# Patient Record
Sex: Male | Born: 1975 | Race: White | Hispanic: No | Marital: Single | Smoking: Current every day smoker
Health system: Southern US, Community
[De-identification: ages and names within clinical notes are randomized; demographics above are authoritative.]

---

## 2001-02-16 ENCOUNTER — Emergency Department (HOSPITAL_COMMUNITY): Admission: EM | Admit: 2001-02-16 | Discharge: 2001-02-16 | Payer: Self-pay | Admitting: *Deleted

## 2004-12-29 ENCOUNTER — Ambulatory Visit: Payer: Self-pay | Admitting: Family Medicine

## 2005-03-25 ENCOUNTER — Ambulatory Visit: Payer: Self-pay | Admitting: Family Medicine

## 2006-08-12 ENCOUNTER — Inpatient Hospital Stay (HOSPITAL_COMMUNITY): Admission: AC | Admit: 2006-08-12 | Discharge: 2006-08-16 | Payer: Self-pay

## 2007-10-22 ENCOUNTER — Emergency Department (HOSPITAL_COMMUNITY): Admission: EM | Admit: 2007-10-22 | Discharge: 2007-10-23 | Payer: Self-pay | Admitting: Emergency Medicine

## 2007-10-26 ENCOUNTER — Emergency Department (HOSPITAL_COMMUNITY): Admission: EM | Admit: 2007-10-26 | Discharge: 2007-10-26 | Payer: Self-pay | Admitting: Emergency Medicine

## 2009-11-18 ENCOUNTER — Emergency Department (HOSPITAL_COMMUNITY): Admission: EM | Admit: 2009-11-18 | Discharge: 2009-11-18 | Payer: Self-pay | Admitting: Emergency Medicine
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
## 2010-08-22 ENCOUNTER — Emergency Department (HOSPITAL_COMMUNITY): Payer: Self-pay

## 2010-08-22 ENCOUNTER — Emergency Department (HOSPITAL_COMMUNITY)
Admission: EM | Admit: 2010-08-22 | Discharge: 2010-08-22 | Disposition: A | Discharge status: Home / Self Care | Payer: Self-pay | Attending: Emergency Medicine | Admitting: Emergency Medicine

## 2010-08-22 DIAGNOSIS — R1032 Left lower quadrant pain: Secondary | ICD-10-CM | POA: Insufficient documentation

## 2010-08-22 DIAGNOSIS — K5732 Diverticulitis of large intestine without perforation or abscess without bleeding: Secondary | ICD-10-CM | POA: Insufficient documentation

## 2010-08-22 DIAGNOSIS — N509 Disorder of male genital organs, unspecified: Secondary | ICD-10-CM | POA: Insufficient documentation

## 2010-08-22 LAB — URINALYSIS, ROUTINE W REFLEX MICROSCOPIC
Glucose, UA: NEGATIVE mg/dL
Hgb urine dipstick: NEGATIVE
Leukocytes, UA: NEGATIVE
Specific Gravity, Urine: 1.015 (ref 1.005–1.030)

## 2010-08-22 LAB — COMPREHENSIVE METABOLIC PANEL
ALT: 28 U/L (ref 0–53)
AST: 24 U/L (ref 0–37)
CO2: 28 mEq/L (ref 19–32)
Calcium: 9.8 mg/dL (ref 8.4–10.5)
Chloride: 100 mEq/L (ref 96–112)
GFR calc Af Amer: >60 mL/min (ref >60)
GFR calc non Af Amer: >60 mL/min (ref >60)
Glucose, Bld: 102 mg/dL — ABNORMAL HIGH (ref 70–99)
Sodium: 137 mEq/L (ref 135–145)
Total Bilirubin: 0.2 mg/dL — ABNORMAL LOW (ref 0.3–1.2)

## 2010-08-22 LAB — CBC
Hemoglobin: 13.4 g/dL (ref 13.0–17.0)
MCH: 30.3 pg (ref 26.0–34.0)
MCV: 87.3 fL (ref 78.0–100.0)
Platelets: 329 10*3/uL (ref 150–400)
RBC: 4.42 MIL/uL (ref 4.22–5.81)
WBC: 14.3 10*3/uL — ABNORMAL HIGH (ref 4.0–10.5)

## 2010-08-22 LAB — DIFFERENTIAL
Basophils Relative: 0 % (ref 0–1)
Eosinophils Absolute: 0.5 10*3/uL (ref 0.0–0.7)
Lymphs Abs: 4 10*3/uL (ref 0.7–4.0)
Monocytes Relative: 7 % (ref 3–12)
Neutro Abs: 8.8 10*3/uL — ABNORMAL HIGH (ref 1.7–7.7)
Neutrophils Relative %: 62 % (ref 43–77)

## 2010-08-23 ENCOUNTER — Emergency Department (HOSPITAL_COMMUNITY)
Admission: EM | Admit: 2010-08-23 | Discharge: 2010-08-23 | Discharge status: Against Medical Advice | Payer: Self-pay | Attending: Emergency Medicine | Admitting: Emergency Medicine

## 2010-08-23 ENCOUNTER — Encounter: Payer: Self-pay | Admitting: Emergency Medicine

## 2010-08-23 DIAGNOSIS — N509 Disorder of male genital organs, unspecified: Secondary | ICD-10-CM | POA: Insufficient documentation

## 2010-08-23 DIAGNOSIS — R112 Nausea with vomiting, unspecified: Secondary | ICD-10-CM | POA: Insufficient documentation

## 2010-08-23 DIAGNOSIS — R1032 Left lower quadrant pain: Secondary | ICD-10-CM | POA: Insufficient documentation

## 2010-08-23 DIAGNOSIS — F17200 Nicotine dependence, unspecified, uncomplicated: Secondary | ICD-10-CM | POA: Insufficient documentation

## 2010-08-23 DIAGNOSIS — K5732 Diverticulitis of large intestine without perforation or abscess without bleeding: Secondary | ICD-10-CM | POA: Insufficient documentation

## 2010-08-23 LAB — COMPREHENSIVE METABOLIC PANEL
ALT: 34 U/L (ref 0–53)
AST: 29 U/L (ref 0–37)
Albumin: 3.6 g/dL (ref 3.5–5.2)
Calcium: 10.1 mg/dL (ref 8.4–10.5)
GFR calc Af Amer: >60 mL/min (ref >60)
Glucose, Bld: 107 mg/dL — ABNORMAL HIGH (ref 70–99)
Potassium: 4 mEq/L (ref 3.5–5.1)
Sodium: 135 mEq/L (ref 135–145)
Total Protein: 7.4 g/dL (ref 6.0–8.3)

## 2010-08-23 LAB — URINALYSIS, ROUTINE W REFLEX MICROSCOPIC
Nitrite: NEGATIVE
Specific Gravity, Urine: 1.025 (ref 1.005–1.030)
Urobilinogen, UA: 0.2 mg/dL (ref 0.0–1.0)
pH: 5.5 (ref 5.0–8.0)

## 2010-08-23 LAB — CBC
MCH: 30.6 pg (ref 26.0–34.0)
MCHC: 35.2 g/dL (ref 30.0–36.0)
Platelets: 322 10*3/uL (ref 150–400)
RDW: 13.2 % (ref 11.5–15.5)

## 2010-08-23 LAB — URINE MICROSCOPIC-ADD ON

## 2010-08-23 LAB — DIFFERENTIAL
Basophils Absolute: 0 10*3/uL (ref 0.0–0.1)
Basophils Relative: 0 % (ref 0–1)
Eosinophils Absolute: 0.4 10*3/uL (ref 0.0–0.7)
Neutro Abs: 9.2 10*3/uL — ABNORMAL HIGH (ref 1.7–7.7)
Neutrophils Relative %: 65 % (ref 43–77)

## 2010-08-23 MED ORDER — CIPROFLOXACIN IN D5W 400 MG/200ML IV SOLN
400.0000 mg | Freq: Once | INTRAVENOUS | Status: AC
  Administered 2010-08-23: 400 mg via INTRAVENOUS
  Filled 2010-08-23: qty 200

## 2010-08-23 MED ORDER — ONDANSETRON HCL 4 MG/2ML IJ SOLN
4.0000 mg | Freq: Once | INTRAMUSCULAR | Status: AC
  Administered 2010-08-23: 4 mg via INTRAVENOUS
  Filled 2010-08-23: qty 2

## 2010-08-23 MED ORDER — HYDROMORPHONE HCL 1 MG/ML IJ SOLN
1.0000 mg | Freq: Once | INTRAMUSCULAR | Status: AC
  Administered 2010-08-23: 1 mg via INTRAVENOUS
  Filled 2010-08-23: qty 1

## 2010-08-23 MED ORDER — SODIUM CHLORIDE 0.9 % IV SOLN: INTRAVENOUS | Status: DC

## 2010-08-23 MED ORDER — SODIUM CHLORIDE 0.9 % IV SOLN
999.0000 mL | Freq: Once | INTRAVENOUS | Status: AC
  Administered 2010-08-23: 1000 mL via INTRAVENOUS

## 2010-08-23 MED ORDER — METRONIDAZOLE IN NACL 5-0.79 MG/ML-% IV SOLN
500.0000 mg | Freq: Once | INTRAVENOUS | Status: AC
  Administered 2010-08-23: 500 mg via INTRAVENOUS
  Filled 2010-08-23: qty 100

## 2010-08-23 NOTE — ED Provider Notes (Signed)
History     Chief Complaint  Patient presents with  . Abdominal Pain    Pt c/o L lower abd pain and testicle pain x 3 days. here yesterday and was supposed to be admitted.    HPI  History reviewed. No pertinent past medical history.  History reviewed. No pertinent past surgical history.  History reviewed. No pertinent family history.  History  Substance Use Topics  . Smoking status: Current Everyday Smoker -- 0.5 packs/day    Types: Cigarettes  . Smokeless tobacco: Not on file  . Alcohol Use: Yes     on weekends      Review of Systems  Physical Exam  BP 111/77  Pulse 84  Temp(Src) 97.6 F (36.4 C) (Oral)  Resp 20  Ht 5\' 6"  (1.676 m)  Wt 195 lb (88.451 kg)  BMI 31.47 kg/m2  SpO2 98%  Physical Exam  ED Course  Procedures  MDM Patient with diverticulitis diagnosed yesterday by CT scan. He refused admission so that he could take care of some personal matters, returns to be admitted. He is very tender across the lower abdomen.      Dione Booze, MD 08/23/10 1215

## 2010-08-23 NOTE — ED Notes (Signed)
Pt req something to eat and drink. Pt was already aware he could not have either per edpa. Pt req now to go out to smoke. Pt made aware e he cannot. Pt is to be admit but unsure at this moment if he will stay

## 2010-08-23 NOTE — ED Provider Notes (Signed)
History     Chief Complaint  Patient presents with  . Abdominal Pain    Pt c/o L lower abd pain and testicle pain x 3 days. here yesterday and was supposed to be admitted.    HPI Comments: PAtient was seen here yesterday and treated for diverticulitis.  It was recommended to him that he be admitted but he did not have anyone to stay with his daughter that that time.  Returns today c/o worsening pain and requesting admission  Patient is a 35 y.o. male presenting with abdominal pain. The history is provided by the patient.  Abdominal Pain The primary symptoms of the illness include abdominal pain, nausea and vomiting. The primary symptoms of the illness do not include fever, shortness of breath or hematemesis. The current episode started more than 2 days ago. The onset of the illness was gradual. The problem has been gradually worsening.  Nausea began 2 days ago. The nausea is associated with eating. The nausea is exacerbated by food.  The vomiting began 2 days ago. Vomiting occurs 2 to 5 times per day. The emesis contains stomach contents.  The patient has had a change in bowel habit. Additional symptoms associated with the illness include constipation and back pain. Symptoms associated with the illness do not include chills, urgency or hematuria. Significant associated medical issues do not include PUD, diabetes, substance abuse or cardiac disease.    History reviewed. No pertinent past medical history.  History reviewed. No pertinent past surgical history.  History reviewed. No pertinent family history.  History  Substance Use Topics  . Smoking status: Current Everyday Smoker -- 0.5 packs/day    Types: Cigarettes  . Smokeless tobacco: Not on file  . Alcohol Use: Yes     on weekends      Review of Systems  Constitutional: Negative for fever and chills.  HENT: Negative.   Eyes: Negative for photophobia and visual disturbance.  Respiratory: Negative for cough, shortness of breath  and wheezing.   Cardiovascular: Negative for chest pain and leg swelling.  Gastrointestinal: Positive for nausea, vomiting, abdominal pain and constipation. Negative for blood in stool, abdominal distention and hematemesis.  Genitourinary: Positive for testicular pain. Negative for urgency, hematuria, flank pain, scrotal swelling and difficulty urinating.  Musculoskeletal: Positive for back pain.  Skin: Negative.   Neurological: Negative.   Hematological: Negative.     Physical Exam  BP 111/77  Pulse 84  Temp(Src) 97.6 F (36.4 C) (Oral)  Resp 20  Ht 5\' 6"  (1.676 m)  Wt 195 lb (88.451 kg)  BMI 31.47 kg/m2  SpO2 98%  Physical Exam  Constitutional: He is oriented to person, place, and time. He appears well-developed and well-nourished.  Non-toxic appearance.    HENT:  Head: Normocephalic and atraumatic.  Eyes: EOM are normal. Pupils are equal, round, and reactive to light.  Neck: Normal range of motion. Neck supple.  Cardiovascular: Normal rate, regular rhythm and normal heart sounds.   Pulmonary/Chest: Effort normal and breath sounds normal. No respiratory distress.  Abdominal: Soft. Bowel sounds are normal. He exhibits no distension. There is no splenomegaly. There is tenderness in the left lower quadrant. There is guarding. There is no rigidity and no rebound.  Musculoskeletal: Normal range of motion.  Neurological: He is alert and oriented to person, place, and time.  Skin: Skin is warm and dry.  Psychiatric: He has a normal mood and affect.    ED Course  Procedures  MDM  Patient had nml [place]  of testicles and CT abd and pelvis on7/6/12.  CT scan shows sigmoid diverticulitis w/o abscess or perforation  I have reviewed all results   Eivin Mascio L. Omesha Bowerman, [place] 08/23/10 1608

## 2010-08-23 NOTE — ED Notes (Signed)
Pt pulled iv out. Stated he was going home. edp aware

## 2010-08-23 NOTE — ED Notes (Signed)
Pt states he was here yesterday with his testicles swollen. Was to be admit but could not stay. Back today to be admit
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
## 2010-11-08 NOTE — ED Provider Notes (Signed)
Medical screening examination/treatment/procedure(s) were performed by non-physician practitioner and as supervising physician I was immediately available for consultation/collaboration.   Shelda Jakes, MD 11/08/10 [phone]

## 2010-11-19 LAB — RAPID URINE DRUG SCREEN, HOSP PERFORMED
Amphetamines: NOT DETECTED
Barbiturates: NOT DETECTED
Tetrahydrocannabinol: NOT DETECTED

## 2010-11-19 LAB — URINALYSIS, ROUTINE W REFLEX MICROSCOPIC
Bilirubin Urine: NEGATIVE
Hgb urine dipstick: NEGATIVE
Protein, ur: NEGATIVE
Urobilinogen, UA: 0.2

## 2010-12-03 LAB — RAPID URINE DRUG SCREEN, HOSP PERFORMED
Amphetamines: NOT DETECTED
Cocaine: POSITIVE — AB
Opiates: POSITIVE — AB
Tetrahydrocannabinol: NOT DETECTED

## 2010-12-03 LAB — ETHANOL
Alcohol, Ethyl (B): 33 — ABNORMAL HIGH
Alcohol, Ethyl (B): 76 — ABNORMAL HIGH

## 2010-12-03 LAB — CBC
HCT: 38.2 — ABNORMAL LOW
Hemoglobin: 13.2
Hemoglobin: 14.1
Platelets: 258
RBC: 4.19 — ABNORMAL LOW
RBC: 4.56
RDW: 14.4 — ABNORMAL HIGH
WBC: 12.6 — ABNORMAL HIGH
WBC: 22.7 — ABNORMAL HIGH

## 2010-12-03 LAB — I-STAT 8, (EC8 V) (CONVERTED LAB)
Bicarbonate: 20.6
Operator id: 270651
Sodium: 141
TCO2: 22

## 2010-12-03 LAB — BASIC METABOLIC PANEL
BUN: 8
Calcium: 8.2 — ABNORMAL LOW
GFR calc non Af Amer: >60
GFR calc non Af Amer: >60
Glucose, Bld: 121 — ABNORMAL HIGH
Potassium: 3.8
Sodium: 137
Sodium: 140

## 2010-12-03 LAB — URINALYSIS, ROUTINE W REFLEX MICROSCOPIC
Bilirubin Urine: NEGATIVE
Leukocytes, UA: NEGATIVE
Nitrite: NEGATIVE
Specific Gravity, Urine: 1.028
Urobilinogen, UA: 0.2
pH: 6

## 2010-12-03 LAB — POCT I-STAT CREATININE
Creatinine, Ser: 1.1
Operator id: 270651

## 2010-12-03 LAB — URINE MICROSCOPIC-ADD ON

## 2010-12-03 LAB — PROTIME-INR: INR: 1.1

## 2011-05-19 ENCOUNTER — Emergency Department (HOSPITAL_COMMUNITY): Payer: Self-pay

## 2011-05-19 ENCOUNTER — Encounter (HOSPITAL_COMMUNITY): Payer: Self-pay | Admitting: Emergency Medicine

## 2011-05-19 ENCOUNTER — Emergency Department (HOSPITAL_COMMUNITY)
Admission: EM | Admit: 2011-05-19 | Discharge: 2011-05-19 | Disposition: A | Discharge status: Home / Self Care | Payer: Self-pay | Attending: Emergency Medicine | Admitting: Emergency Medicine

## 2011-05-19 DIAGNOSIS — K921 Melena: Secondary | ICD-10-CM | POA: Insufficient documentation

## 2011-05-19 DIAGNOSIS — K5792 Diverticulitis of intestine, part unspecified, without perforation or abscess without bleeding: Secondary | ICD-10-CM

## 2011-05-19 DIAGNOSIS — R112 Nausea with vomiting, unspecified: Secondary | ICD-10-CM | POA: Insufficient documentation

## 2011-05-19 DIAGNOSIS — K5900 Constipation, unspecified: Secondary | ICD-10-CM | POA: Insufficient documentation

## 2011-05-19 DIAGNOSIS — K5732 Diverticulitis of large intestine without perforation or abscess without bleeding: Secondary | ICD-10-CM | POA: Insufficient documentation

## 2011-05-19 DIAGNOSIS — R1032 Left lower quadrant pain: Secondary | ICD-10-CM | POA: Insufficient documentation

## 2011-05-19 DIAGNOSIS — N509 Disorder of male genital organs, unspecified: Secondary | ICD-10-CM | POA: Insufficient documentation

## 2011-05-19 DIAGNOSIS — R197 Diarrhea, unspecified: Secondary | ICD-10-CM | POA: Insufficient documentation

## 2011-05-19 LAB — COMPREHENSIVE METABOLIC PANEL
Albumin: 4 g/dL (ref 3.5–5.2)
BUN: 12 mg/dL (ref 6–23)
Chloride: 104 mEq/L (ref 96–112)
Creatinine, Ser: 0.85 mg/dL (ref 0.50–1.35)
GFR calc non Af Amer: >90 mL/min (ref >90)
Total Bilirubin: 0.2 mg/dL — ABNORMAL LOW (ref 0.3–1.2)

## 2011-05-19 LAB — CBC
HCT: 45 % (ref 39.0–52.0)
Hemoglobin: 15.6 g/dL (ref 13.0–17.0)
MCH: 30.4 pg (ref 26.0–34.0)
MCHC: 34.7 g/dL (ref 30.0–36.0)
MCV: 87.5 fL (ref 78.0–100.0)

## 2011-05-19 LAB — URINE MICROSCOPIC-ADD ON

## 2011-05-19 LAB — URINALYSIS, ROUTINE W REFLEX MICROSCOPIC
Ketones, ur: NEGATIVE mg/dL
Leukocytes, UA: NEGATIVE
Nitrite: NEGATIVE
Protein, ur: NEGATIVE mg/dL
pH: 5 (ref 5.0–8.0)

## 2011-05-19 LAB — LIPASE, BLOOD: Lipase: 49 U/L (ref 11–59)

## 2011-05-19 LAB — DIFFERENTIAL
Basophils Relative: 0 % (ref 0–1)
Monocytes Absolute: 1.4 10*3/uL — ABNORMAL HIGH (ref 0.1–1.0)
Monocytes Relative: 7 % (ref 3–12)
Neutro Abs: 14.7 10*3/uL — ABNORMAL HIGH (ref 1.7–7.7)

## 2011-05-19 MED ORDER — OXYCODONE-ACETAMINOPHEN 5-325 MG PO TABS: 2.0000 | ORAL_TABLET | ORAL | Status: AC | PRN

## 2011-05-19 MED ORDER — ONDANSETRON HCL 4 MG/2ML IJ SOLN
4.0000 mg | Freq: Once | INTRAMUSCULAR | Status: AC
  Administered 2011-05-19: 4 mg via INTRAVENOUS
  Filled 2011-05-19: qty 2

## 2011-05-19 MED ORDER — DOCUSATE SODIUM 100 MG PO CAPS: 100.0000 mg | ORAL_CAPSULE | Freq: Two times a day (BID) | ORAL | Status: AC

## 2011-05-19 MED ORDER — SODIUM CHLORIDE 0.9 % IV BOLUS (SEPSIS)
1000.0000 mL | Freq: Once | INTRAVENOUS | Status: AC
  Administered 2011-05-19: 1000 mL via INTRAVENOUS

## 2011-05-19 MED ORDER — SODIUM CHLORIDE 0.9 % IV SOLN: INTRAVENOUS | Status: DC

## 2011-05-19 MED ORDER — CIPROFLOXACIN HCL 500 MG PO TABS: 500.0000 mg | ORAL_TABLET | Freq: Two times a day (BID) | ORAL | Status: AC

## 2011-05-19 MED ORDER — HYDROMORPHONE HCL PF 1 MG/ML IJ SOLN
1.0000 mg | Freq: Once | INTRAMUSCULAR | Status: AC
  Administered 2011-05-19: 1 mg via INTRAVENOUS
  Filled 2011-05-19: qty 1

## 2011-05-19 MED ORDER — IOHEXOL 300 MG/ML  SOLN
100.0000 mL | Freq: Once | INTRAMUSCULAR | Status: AC | PRN
  Administered 2011-05-19: 100 mL via INTRAVENOUS

## 2011-05-19 MED ORDER — METRONIDAZOLE 500 MG PO TABS: 500.0000 mg | ORAL_TABLET | Freq: Three times a day (TID) | ORAL | Status: AC

## 2011-05-19 MED ORDER — ONDANSETRON HCL 8 MG PO TABS: 8.0000 mg | ORAL_TABLET | Freq: Three times a day (TID) | ORAL | Status: AC | PRN

## 2011-05-19 NOTE — ED Provider Notes (Signed)
History  This chart was scribed for Steven Bonier, MD by Steven Skinner. This patient was seen in room APA12/APA12 and the patient's care was started at 4:06PM.  CSN: [id number]  Arrival date & time 05/19/11  1448   First MD Initiated Contact with Patient 05/19/11 1550      Chief Complaint  Patient presents with  . Abdominal Pain    The history is provided by the patient. No language interpreter was used.    Steven Skinner is a 36 y.o. male with a h/o diverticulitis who presents to the Emergency Department complaining of 4 to 5 days of gradual onset, gradually worsening, constant LLQ abdominal pain. The pain is described as a throbbing and sharp feeling. It radiates to the left testicle. The pain is worse with movement and touch. He lists nausea, emesis and constipation as associated symptoms. He has not taken any medications PTA to improve pain. Pt states that he came in to be evaluated today, because the pain is at its worst and he fears that he might have diverticulitis again.  He reports one episode of hematochezia on 05/15/11 and states that he has not had a BM since then. He reports two episodes of hematemesis  today and yesterday. He denies fever, congestion, diarrhea, chest pain, dysuria, urgency and HA as associated symptoms. Pt has no other h/o chronic medical conditions. He is a current everyday smoker and occasional alcohol user.  Past medical history: Diverticulitis   History reviewed. No pertinent past surgical history.  No family history on file.  History  Substance Use Topics  . Smoking status: Current Everyday Smoker -- 1.0 packs/day    Types: Cigarettes  . Smokeless tobacco: Not on file  . Alcohol Use: Yes     on weekends      Review of Systems  Constitutional: Negative for fever and chills.  HENT: Negative for congestion, sore throat and neck pain.   Eyes: Negative for pain.  Respiratory: Negative for cough and shortness of breath.   Cardiovascular:  Negative for chest pain.  Gastrointestinal: Positive for nausea, vomiting, abdominal pain and constipation. Negative for diarrhea.  Genitourinary: Positive for testicular pain (Left). Negative for dysuria, urgency and hematuria.  Musculoskeletal: Negative for back pain.  Skin: Negative for rash.  Neurological: Negative for seizures and headaches.  Psychiatric/Behavioral: Negative for confusion.    Allergies  Review of patient's allergies indicates no known allergies.  Home Medications  No current outpatient prescriptions on file.  Triage Vitals: BP 115/82  Pulse 97  Temp(Src) 98.1 F (36.7 C) (Oral)  Resp 20  Ht 5\' 7"  (1.702 m)  Wt 195 lb (88.451 kg)  BMI 30.54 kg/m2  SpO2 99%  Physical Exam  Nursing note and vitals reviewed. Constitutional: He is oriented to person, place, and time. He appears well-developed and well-nourished.  HENT:  Head: Normocephalic and atraumatic.  Eyes: Conjunctivae and EOM are normal.  Neck: Normal range of motion. Neck supple.  Cardiovascular: Normal rate, regular rhythm and normal heart sounds.  Exam reveals no gallop and no friction rub.   No murmur heard. Pulmonary/Chest: Effort normal and breath sounds normal. No respiratory distress. He has no wheezes. He has no rales.       Lungs are clear to auscultation in all fields, no rhonchi  Abdominal: Soft. Bowel sounds are normal. There is tenderness (LU and LL quandrants).  Genitourinary:       Testicles are normal bilaterally and equal in size, no masses noted, they  are non-tender to palpation  Musculoskeletal: Normal range of motion. He exhibits no edema.  Neurological: He is alert and oriented to person, place, and time.  Skin: Skin is warm and dry. No rash noted.  Psychiatric: He has a normal mood and affect. His behavior is normal.    ED Course  Procedures (including critical care time)  DIAGNOSTIC STUDIES: Oxygen Saturation is 99% on room air, normal by my interpretation.     COORDINATION OF CARE: 4:10PM-Discussed treatment plan with pt and pt agreed to plan.   Labs Reviewed  CBC - Abnormal; Notable for the following:    WBC 20.0 (*)    All other components within normal limits  DIFFERENTIAL - Abnormal; Notable for the following:    Neutro Abs 14.7 (*)    Monocytes Absolute 1.4 (*)    All other components within normal limits  COMPREHENSIVE METABOLIC PANEL - Abnormal; Notable for the following:    Total Bilirubin 0.2 (*)    All other components within normal limits  URINALYSIS, ROUTINE W REFLEX MICROSCOPIC - Abnormal; Notable for the following:    Specific Gravity, Urine >1.030 (*)    Hgb urine dipstick TRACE (*)    All other components within normal limits  LIPASE, BLOOD  URINE MICROSCOPIC-ADD ON   US Scrotum  05/19/2011  *RADIOLOGY REPORT*  Clinical Data:  Left testicular pain.  SCROTAL ULTRASOUND DOPPLER ULTRASOUND OF THE TESTICLES  Technique: Complete ultrasound examination of the testicles, epididymis, and other scrotal structures was performed.  Color and spectral Doppler ultrasound were also utilized to evaluate blood flow to the testicles.  Comparison:  08/22/2010.  Findings:  Right testis:  Measures 5.3 x 2.2 x 3.2 cm and appears normal.  Left testis:  Measures 4.7 x 2.3 x 2.7 cm and appears normal.  Right epididymis:  Contains a 3 mm epididymal cyst or spermatocele; otherwise normal.  Left epididymis:  Appears normal.  Hydocele:  Trace bilateral hydroceles noted.  Varicocele:  Absent  Pulsed Doppler interrogation of both testes demonstrates normal low resistance flow bilaterally.  Mild nodularity noted in the scrotal skin, for example on image 50.  IMPRESSION:  1.  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 2.  Trace bilateral hydroceles. 3.   Otherwise, no significant abnormality identified.  Original Report Authenticated By: Steven Skinner, M.D.   Ct Abdomen Pelvis W  Contrast  05/19/2011  *RADIOLOGY REPORT*  Clinical Data: Left lower quadrant abdominal pain.  CT ABDOMEN AND PELVIS WITH CONTRAST  Technique:  Multidetector CT imaging of the abdomen and pelvis was performed following the standard protocol during bolus administration of intravenous contrast.  Contrast:  100 ml Omnipaque-300  Comparison: CT scan 08/22/2010.  Findings: The lung bases are clear.  No pleural effusion.  The solid abdominal organs are normal.  The gallbladder is contracted.  No common bile duct dilatation.  No renal or obstructing ureteral calculi.  The stomach, duodenum, small bowel and colon are unremarkable.  No inflammatory changes or mass lesions.  There is diverticulosis of the upper sigmoid colon but no findings for acute diverticulitis. The appendix is normal.  The aorta is normal in caliber.  The major branch vessels are normal.  No mesenteric or retroperitoneal masses or lymphadenopathy.  Small scattered lymph nodes are noted.  The bladder, prostate gland and seminal vesicles are unremarkable. No pelvic mass, adenopathy or free pelvic fluid collections.  No inguinal mass or hernia.  The bony structures are intact.  IMPRESSION:  Unremarkable CT abdomen/pelvis.  Original Report Authenticated By: P. Loralie Champagne, M.D.   [place] Art/ven Flow Abd Pelv Doppler  05/19/2011  *RADIOLOGY REPORT*  Clinical Data:  Left testicular pain.  SCROTAL ULTRASOUND DOPPLER ULTRASOUND OF THE TESTICLES  Technique: Complete ultrasound examination of the testicles, epididymis, and other scrotal structures was performed.  Color and spectral Doppler ultrasound were also utilized to evaluate blood flow to the testicles.  Comparison:  08/22/2010.  Findings:  Right testis:  Measures 5.3 x 2.2 x 3.2 cm and appears normal.  Left testis:  Measures 4.7 x 2.3 x 2.7 cm and appears normal.  Right epididymis:  Contains a 3 mm epididymal cyst or spermatocele; otherwise normal.  Left epididymis:  Appears normal.  Hydocele:  Trace  bilateral hydroceles noted.  Varicocele:  Absent  Pulsed Doppler interrogation of both testes demonstrates normal low resistance flow bilaterally.  Mild nodularity noted in the scrotal skin, for example on image 50.  IMPRESSION:  1.  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 2.  Trace bilateral hydroceles. 3.   Otherwise, no significant abnormality identified.  Original Report Authenticated By: Steven Skinner, M.D.     No diagnosis found.    MDM  Gastritis, peptic ulcer disease, renal colic, urinary tract infection, colitis, constipation, gastroenteritis all considered among other etiologies in the patient's differential diagnosis.  The patient has pain in his testicles, denies penile discharge or dysuria.    I personally performed the services described in this documentation, which was scribed in my presence. The recorded information has been reviewed and considered.  5:49 PM Laboratory and imaging findings have been reviewed. At this time although the patient does not show evidence of diverticulitis on the CT scan, he has a leukocytosis of 20,000, and findings on physical examination suggestive of diverticulitis, similar to his previous episodes of same, and despite the CT scan, I will treat the patient based on his clinical examination and leukocytosis for diverticulitis. No acute findings in the testicles or scrotum.  Steven Bonier, MD 05/19/11 1750

## 2011-05-19 NOTE — Discharge Instructions (Signed)
Abdominal Pain  Abdominal pain can be caused by many things. Your caregiver decides the seriousness of your pain by an examination and possibly blood tests and X-rays. Many cases can be observed and treated at home. Most abdominal pain is not caused by a disease and will probably improve without treatment. However, in many cases, more time must pass before a clear cause of the pain can be found. Before that point, it may not be known if you need more testing, or if hospitalization or surgery is needed.  HOME CARE INSTRUCTIONS   · Do not take laxatives unless directed by your caregiver.  · Take pain medicine only as directed by your caregiver.  · Only take over-the-counter or prescription medicines for pain, discomfort, or fever as directed by your caregiver.  · Try a clear liquid diet (broth, tea, or water) for as long as directed by your caregiver. Slowly move to a bland diet as tolerated.  SEEK IMMEDIATE MEDICAL CARE IF:   · The pain does not go away.  · You have a fever.  · You keep throwing up (vomiting).  · The pain is felt only in portions of the abdomen. Pain in the right side could possibly be appendicitis. In an adult, pain in the left lower portion of the abdomen could be colitis or diverticulitis.  · You pass bloody or black tarry stools.  MAKE SURE YOU:   · Understand these instructions.  · Will watch your condition.  · Will get help right away if you are not doing well or get worse.  Document Released: 11/12/2004 Document Revised: 01/22/2011 Document Reviewed: 09/21/2007  ExitCare® Patient Information ©2012 [employer].  Diverticulitis??  A diverticulum is a small pouch or sac on the colon. Diverticulosis is the presence of these diverticula on the colon. Diverticulitis is the irritation (inflammation) or infection of diverticula.  CAUSES   The colon and its diverticula contain bacteria. If food particles block the tiny opening to a diverticulum, the bacteria inside can grow and cause an increase in  pressure. This leads to infection and inflammation and is called diverticulitis.  SYMPTOMS   · Abdominal pain and tenderness. Usually, the pain is located on the left side of your abdomen. However, it could be located elsewhere.  · Fever.  · Bloating.  · Feeling sick to your stomach (nausea).  · Throwing up (vomiting).  · Abnormal stools.  DIAGNOSIS   Your caregiver will take a history and perform a physical exam. Since many things can cause abdominal pain, other tests may be necessary. Tests may include:  · Blood tests.  · Urine tests.  · X-ray of the abdomen.  · CT scan of the abdomen.  Sometimes, surgery is needed to determine if diverticulitis or other conditions are causing your symptoms.  TREATMENT   Most of the time, you can be treated without surgery. Treatment includes:  · Resting the bowels by only having liquids for a few days. As you improve, you will need to eat a low-fiber diet.  · Intravenous (IV) fluids if you are losing body fluids (dehydrated).  · Antibiotic medicines that treat infections may be given.  · Pain and nausea medicine, if needed.  · Surgery if the inflamed diverticulum has burst.  HOME CARE INSTRUCTIONS   · Try a clear liquid diet (broth, tea, or water for as long as directed by your caregiver). You may then gradually begin a low-fiber diet as tolerated. A low-fiber diet is a diet with less   cereals, rice, and pasta.   Cooked fruits and vegetables or soft fresh fruits and vegetables without the skin.   Ground or well-cooked tender beef, ham, veal, lamb, pork, or poultry.   Eggs and seafood.   After your diverticulitis symptoms have improved, your caregiver may put you on a high-fiber diet. A high-fiber diet includes 14 grams of fiber for every 1000 calories consumed. For a standard 2000 calorie diet, you would need 28 grams of fiber. Follow these diet  guidelines to help you increase the fiber in your diet. It is important to slowly increase the amount fiber in your diet to avoid gas, constipation, and bloating.   Choose whole-grain breads, cereals, pasta, and brown rice.   Choose fresh fruits and vegetables with the skin on. Do not overcook vegetables because the more vegetables are cooked, the more fiber is lost.   Choose more nuts, seeds, legumes, dried peas, beans, and lentils.   Look for food products that have greater than 3 grams of fiber per serving on the Nutrition Facts label.   Take all medicine as directed by your caregiver.   If your caregiver has given you a follow-up appointment, it is very important that you go. Not going could result in lasting (chronic) or permanent injury, pain, and disability. If there is any problem keeping the appointment, call to reschedule.  SEEK MEDICAL CARE IF:   Your pain does not improve.   You have a hard time advancing your diet beyond clear liquids.   Your bowel movements do not return to normal.  SEEK IMMEDIATE MEDICAL CARE IF:   Your pain becomes worse.   You have an oral temperature above 102 F (38.9 C), not controlled by medicine.   You have repeated vomiting.   You have bloody or black, tarry stools.   Symptoms that brought you to your caregiver become worse or are not getting better.  MAKE SURE YOU:   Understand these instructions.   Will watch your condition.   Will get help right away if you are not doing well or get worse.  Document Released: 11/12/2004 Document Revised: 01/22/2011 Document Reviewed: 03/10/2010 [employer] Patient Information 2012 [place], [place].  RESOURCE GUIDE  Dental Problems  Patients with Medicaid: [facility] [phone] [address]                                           [phone] [address] Phone:  [phone]                                                  Phone:  [phone]  If unable to  pay or uninsured, contact:  [facility] or [facility] to become qualified for the adult dental clinic.  Chronic Pain Problems Contact [facility]  [phone] Patients need to be referred by their primary care doctor.  Insufficient Money for Medicine Contact [employer]:  call "211" or [facility] [phone].  No Primary Care Doctor Call [facility]  [phone] Other agencies that provide inexpensive medical care    [facility]  [phone]    [facility]  [phone]    [facility]  [phone]    [facility]  [phone]    [facility]  [phone]    [facility]  [phone]  Psychological Services [facility] Behavioral Health  [phone] [facility]  [phone] [facility]   [phone] (emergency services [phone])  Substance Abuse Resources [facility]  [phone] [facility] (909)[phone] [facility] ([phone] Floyd[facility] Flock ([phone] Residential & Outpatient Substance Abuse Program  [phone]  Abuse/Neglect [facility] Child Abuse Hotline 7651644251 [facility] Child Abuse Hotline 970-162-2936 (After Hours)  Emergency Shelter [facility] Ministries [phone]  Maternity Homes Room at the [place] [facility] [place] [phone] [facility] [phone]  MRSA Hotline #:   (732) 080-2438    [facility] Resources  [facility] [place]     [employer]                          [facility] Dept. [address] [place]                       [address]      [address]  Blondell Reveal Phone:  [phone]                                   Phone:  [phone]                 Phone:  [phone]  [facility] Mental Health Phone:  [phone]  [facility] Child  Abuse Hotline (959)691-4276 [phone] (After Hours)

## 2011-05-19 NOTE — ED Notes (Signed)
Nurse took a discharged pt out to her car. Mr. Rowand was out at sidewalk with IV, smoking. Pt and family made aware he was not to go to smoke.

## 2011-05-19 NOTE — ED Notes (Signed)
Pt c/o left lower abd/groin pain since yesterday.dnies n/v/d. C/o constipation.  lnbm-05/15/11.

## 2011-05-19 NOTE — ED Notes (Signed)
Pt DC to home with steady gait 

## 2013-10-28 ENCOUNTER — Emergency Department (HOSPITAL_COMMUNITY): Payer: Self-pay

## 2013-10-28 ENCOUNTER — Emergency Department (HOSPITAL_COMMUNITY)
Admission: EM | Admit: 2013-10-28 | Discharge: 2013-10-28 | Disposition: A | Discharge status: Home / Self Care | Payer: Self-pay | Attending: Emergency Medicine | Admitting: Emergency Medicine

## 2013-10-28 ENCOUNTER — Encounter (HOSPITAL_COMMUNITY): Payer: Self-pay | Admitting: Emergency Medicine

## 2013-10-28 DIAGNOSIS — Y929 Unspecified place or not applicable: Secondary | ICD-10-CM | POA: Insufficient documentation

## 2013-10-28 DIAGNOSIS — S79919A Unspecified injury of unspecified hip, initial encounter: Secondary | ICD-10-CM | POA: Insufficient documentation

## 2013-10-28 DIAGNOSIS — F17200 Nicotine dependence, unspecified, uncomplicated: Secondary | ICD-10-CM | POA: Insufficient documentation

## 2013-10-28 DIAGNOSIS — S79929A Unspecified injury of unspecified thigh, initial encounter: Secondary | ICD-10-CM

## 2013-10-28 DIAGNOSIS — Z791 Long term (current) use of non-steroidal anti-inflammatories (NSAID): Secondary | ICD-10-CM | POA: Insufficient documentation

## 2013-10-28 DIAGNOSIS — IMO0002 Reserved for concepts with insufficient information to code with codable children: Secondary | ICD-10-CM | POA: Insufficient documentation

## 2013-10-28 DIAGNOSIS — S3981XA Other specified injuries of abdomen, initial encounter: Secondary | ICD-10-CM | POA: Insufficient documentation

## 2013-10-28 DIAGNOSIS — S301XXA Contusion of abdominal wall, initial encounter: Secondary | ICD-10-CM | POA: Insufficient documentation

## 2013-10-28 DIAGNOSIS — R296 Repeated falls: Secondary | ICD-10-CM | POA: Insufficient documentation

## 2013-10-28 DIAGNOSIS — Y9389 Activity, other specified: Secondary | ICD-10-CM | POA: Insufficient documentation

## 2013-10-28 LAB — URINALYSIS, ROUTINE W REFLEX MICROSCOPIC
Bilirubin Urine: NEGATIVE
GLUCOSE, UA: NEGATIVE mg/dL
Ketones, ur: NEGATIVE mg/dL
LEUKOCYTES UA: NEGATIVE
Nitrite: NEGATIVE
PH: 6 (ref 5.0–8.0)
Protein, ur: NEGATIVE mg/dL
SPECIFIC GRAVITY, URINE: 1.025 (ref 1.005–1.030)
Urobilinogen, UA: 0.2 mg/dL (ref 0.0–1.0)

## 2013-10-28 LAB — URINE MICROSCOPIC-ADD ON

## 2013-10-28 MED ORDER — HYDROMORPHONE HCL PF 1 MG/ML IJ SOLN
1.0000 mg | Freq: Once | INTRAMUSCULAR | Status: AC
  Administered 2013-10-28: 1 mg via INTRAMUSCULAR
  Filled 2013-10-28: qty 1

## 2013-10-28 MED ORDER — OXYCODONE-ACETAMINOPHEN 5-325 MG PO TABS: 1.0000 | ORAL_TABLET | ORAL | Status: DC | PRN

## 2013-10-28 NOTE — ED Provider Notes (Signed)
CSN: [id number]     Arrival date & time 10/28/13  4098 History   First MD Initiated Contact with Patient 10/28/13 0737    This chart was scribed for Donnetta Hutching, MD by Freida Busman, ED Scribe. This patient was seen in room APA11/APA11 and the patient's care was started 7:37 AM.  Chief Complaint  Patient presents with  . Fall    The history is provided by the patient.    HPI Comments:  Steven Skinner is a 38 y.o. male who presents to the Emergency Department s/p fall about 24 hours ago complaining of moderate constant pain following the incident. Pt reports sudden onset of pain ~1hr following the fall. Pt states he fell from a rafter and landed on his right side. Pt denies head injury and LOC. At this time pt c/o pain to his right hip, right lower back and pain that radiates down his thigh to his right leg. No alleviating factors noted.    History reviewed. No pertinent past medical history. History reviewed. No pertinent past surgical history. History reviewed. No pertinent family history. History  Substance Use Topics  . Smoking status: Current Every Day Smoker -- 1.00 packs/day    Types: Cigarettes  . Smokeless tobacco: Not on file  . Alcohol Use: Yes     Comment: on weekends    Review of Systems  All other systems reviewed and are negative.  10 systems reviewed and negative other than pertinent ROS in HPI    Allergies  Review of patient's allergies indicates no known allergies.  Home Medications   Prior to Admission medications   Medication Sig Start Date End Date Taking? Authorizing Provider  calcium carbonate (TUMS - DOSED IN MG ELEMENTAL CALCIUM) 500 MG chewable tablet Chew 3 tablets by mouth daily as needed for indigestion or heartburn.   Yes Historical Provider, MD  Multiple Vitamin (MULTIVITAMIN WITH MINERALS) TABS tablet Take 1 tablet by mouth daily.   Yes Historical Provider, MD  naproxen sodium (ANAPROX) 220 MG tablet Take 440 mg by mouth daily as needed (pain).    Yes Historical Provider, MD  oxyCODONE-acetaminophen (PERCOCET) 5-325 MG per tablet Take 1-2 tablets by mouth every 4 (four) hours as needed. 10/28/13   Donnetta Hutching, MD   BP 96/59  Pulse 67  Temp(Src) 97.6 F (36.4 C) (Oral)  Resp 18  Ht  (1.702 m)  Wt 198 lb (89.812 kg)  BMI 31.00 kg/m2  SpO2 99% Physical Exam  Nursing note and vitals reviewed. Constitutional: He is oriented to person, place, and time. He appears well-developed and well-nourished.  HENT:  Head: Normocephalic and atraumatic.  Eyes: Conjunctivae and EOM are normal. Pupils are equal, round, and reactive to light.  Neck: Normal range of motion. Neck supple.  Cardiovascular: Normal rate, regular rhythm and normal heart sounds.   Pulmonary/Chest: Effort normal and breath sounds normal.  Abdominal: Soft. Bowel sounds are normal.  Musculoskeletal:  Tenderness to right lower back, buttock and posterior aspect of right femur  Neurological: He is alert and oriented to person, place, and time.  Skin: Skin is warm and dry.  10x5 cm area of ecchymosis to right lateral abdomen  Psychiatric: He has a normal mood and affect. His behavior is normal.    ED Course  Procedures (including critical care time)  DIAGNOSTIC STUDIES:  Oxygen Saturation is 98% on RA, normal by my interpretation.    COORDINATION OF CARE:  7:42 AM Discussed treatment plan with pt at bedside and  pt agreed to plan. Labs Review Labs Reviewed  URINALYSIS, ROUTINE W REFLEX MICROSCOPIC - Abnormal; Notable for the following:    Hgb urine dipstick TRACE (*)    All other components within normal limits  URINE MICROSCOPIC-ADD ON    Imaging Review Dg Pelvis 1-2 Views  10/28/2013   CLINICAL DATA:  Fall.  Pain.  EXAM: PELVIS - 1-2 VIEW  COMPARISON:  CT abdomen pelvis 05/19/2011  FINDINGS: Normal bony mineralization. There is no evidence of pelvic fracture or diastasis. No pelvic bone lesions are seen.  IMPRESSION: Negative.   Electronically Signed   By:  Britta Mccreedy M.D.   On: 10/28/2013 09:38   Dg Femur Right  10/28/2013   CLINICAL DATA:  Fall with femur pain.  EXAM: RIGHT FEMUR - 2 VIEW  COMPARISON:  None.  FINDINGS: There is no evidence of fracture or other focal bone lesions. Soft tissues are unremarkable.  IMPRESSION: Negative.   Electronically Signed   By: Britta Mccreedy M.D.   On: 10/28/2013 09:39     EKG Interpretation None      MDM   Final diagnoses:  Contusion, abdominal wall, initial encounter    No head or neck trauma. No evidence of splenic trauma. Large contusion noted on right lateral abdomen. Trace amount of hemoglobin in urinalysis. Plain films of pelvis and right femur negative. Vital signs are normal. Discharge medications Percocet  I personally performed the services described in this documentation, which was scribed in my presence. The recorded information has been reviewed and is accurate.    Donnetta Hutching, MD 10/28/13 1155

## 2013-10-28 NOTE — Discharge Instructions (Signed)
X-rays show no fracture. You have a small amount of blood in your urine. This will need to be rechecked in 2-3 weeks. Increase fluids. Ibuprofen for pain. Can also take a prescription pain medicine which has been given to you.

## 2013-10-28 NOTE — ED Notes (Signed)
Pt reports fell while working in the attic yesterday. Pt reports fell and landed on the raftors. Pt denies hitting head or LOC. Pt reports right hip pain radiating to right thigh. Pt reports painful urination since. Moderate hematoma noted to right hip. Pt not able to bear weight to right leg.

## 2014-04-23 ENCOUNTER — Encounter (HOSPITAL_COMMUNITY): Payer: Self-pay | Admitting: *Deleted

## 2014-04-23 ENCOUNTER — Emergency Department (HOSPITAL_COMMUNITY)
Admission: EM | Admit: 2014-04-23 | Discharge: 2014-04-23 | Disposition: A | Discharge status: Home / Self Care | Payer: Self-pay | Attending: Emergency Medicine | Admitting: Emergency Medicine

## 2014-04-23 DIAGNOSIS — Z720 Tobacco use: Secondary | ICD-10-CM | POA: Insufficient documentation

## 2014-04-23 DIAGNOSIS — Z79899 Other long term (current) drug therapy: Secondary | ICD-10-CM | POA: Insufficient documentation

## 2014-04-23 DIAGNOSIS — R109 Unspecified abdominal pain: Secondary | ICD-10-CM | POA: Insufficient documentation

## 2014-04-23 DIAGNOSIS — Z8719 Personal history of other diseases of the digestive system: Secondary | ICD-10-CM | POA: Insufficient documentation

## 2014-04-23 LAB — COMPREHENSIVE METABOLIC PANEL
ALT: 34 U/L (ref 0–53)
AST: 21 U/L (ref 0–37)
Albumin: 4.1 g/dL (ref 3.5–5.2)
Alkaline Phosphatase: 82 U/L (ref 39–117)
Anion gap: 6 (ref 5–15)
BUN: 13 mg/dL (ref 6–23)
CO2: 29 mmol/L (ref 19–32)
Calcium: 9.5 mg/dL (ref 8.4–10.5)
Chloride: 106 mmol/L (ref 96–112)
Creatinine, Ser: 0.89 mg/dL (ref 0.50–1.35)
GFR calc Af Amer: >90 mL/min (ref >90)
GFR calc non Af Amer: >90 mL/min (ref >90)
Glucose, Bld: 94 mg/dL (ref 70–99)
Potassium: 3.9 mmol/L (ref 3.5–5.1)
Sodium: 141 mmol/L (ref 135–145)
Total Bilirubin: 0.3 mg/dL (ref 0.3–1.2)
Total Protein: 7.7 g/dL (ref 6.0–8.3)

## 2014-04-23 LAB — URINALYSIS, ROUTINE W REFLEX MICROSCOPIC
Glucose, UA: NEGATIVE mg/dL
Hgb urine dipstick: NEGATIVE
Ketones, ur: NEGATIVE mg/dL
Leukocytes, UA: NEGATIVE
Nitrite: NEGATIVE
Protein, ur: NEGATIVE mg/dL
Specific Gravity, Urine: >1.03 — ABNORMAL HIGH (ref 1.005–1.030)
Urobilinogen, UA: 0.2 mg/dL (ref 0.0–1.0)
pH: 6 (ref 5.0–8.0)

## 2014-04-23 LAB — CBC WITH DIFFERENTIAL/PLATELET
Basophils Absolute: 0.1 10*3/uL (ref 0.0–0.1)
Basophils Relative: 0 % (ref 0–1)
Eosinophils Absolute: 0.2 10*3/uL (ref 0.0–0.7)
Eosinophils Relative: 2 % (ref 0–5)
HCT: 41.8 % (ref 39.0–52.0)
Hemoglobin: 14.2 g/dL (ref 13.0–17.0)
Lymphocytes Relative: 22 % (ref 12–46)
Lymphs Abs: 2.9 10*3/uL (ref 0.7–4.0)
MCH: 30.4 pg (ref 26.0–34.0)
MCHC: 34 g/dL (ref 30.0–36.0)
MCV: 89.5 fL (ref 78.0–100.0)
Monocytes Absolute: 1.1 10*3/uL — ABNORMAL HIGH (ref 0.1–1.0)
Monocytes Relative: 8 % (ref 3–12)
Neutro Abs: 9.3 10*3/uL — ABNORMAL HIGH (ref 1.7–7.7)
Neutrophils Relative %: 68 % (ref 43–77)
Platelets: 419 10*3/uL — ABNORMAL HIGH (ref 150–400)
RBC: 4.67 MIL/uL (ref 4.22–5.81)
RDW: 14 % (ref 11.5–15.5)
WBC: 13.5 10*3/uL — ABNORMAL HIGH (ref 4.0–10.5)

## 2014-04-23 MED ORDER — ONDANSETRON HCL 4 MG/2ML IJ SOLN
4.0000 mg | Freq: Once | INTRAMUSCULAR | Status: AC
  Administered 2014-04-23: 4 mg via INTRAMUSCULAR
  Filled 2014-04-23: qty 2

## 2014-04-23 MED ORDER — CIPROFLOXACIN IN D5W 400 MG/200ML IV SOLN
400.0000 mg | Freq: Once | INTRAVENOUS | Status: AC
  Administered 2014-04-23: 400 mg via INTRAVENOUS
  Filled 2014-04-23: qty 200

## 2014-04-23 MED ORDER — CIPROFLOXACIN HCL 500 MG PO TABS: 500.0000 mg | ORAL_TABLET | Freq: Two times a day (BID) | ORAL | Status: DC

## 2014-04-23 MED ORDER — HYDROMORPHONE HCL 1 MG/ML IJ SOLN
1.0000 mg | Freq: Once | INTRAMUSCULAR | Status: AC
  Administered 2014-04-23: 1 mg via INTRAVENOUS
  Filled 2014-04-23: qty 1

## 2014-04-23 MED ORDER — SODIUM CHLORIDE 0.9 % IV BOLUS (SEPSIS)
1000.0000 mL | Freq: Once | INTRAVENOUS | Status: AC
  Administered 2014-04-23: 1000 mL via INTRAVENOUS

## 2014-04-23 MED ORDER — OXYCODONE-ACETAMINOPHEN 5-325 MG PO TABS: 1.0000 | ORAL_TABLET | ORAL | Status: DC | PRN

## 2014-04-23 MED ORDER — METRONIDAZOLE IN NACL 5-0.79 MG/ML-% IV SOLN
500.0000 mg | Freq: Once | INTRAVENOUS | Status: AC
  Administered 2014-04-23: 500 mg via INTRAVENOUS
  Filled 2014-04-23: qty 100

## 2014-04-23 MED ORDER — KETOROLAC TROMETHAMINE 30 MG/ML IJ SOLN
15.0000 mg | Freq: Once | INTRAMUSCULAR | Status: AC
  Administered 2014-04-23: 15 mg via INTRAVENOUS
  Filled 2014-04-23: qty 1

## 2014-04-23 MED ORDER — METRONIDAZOLE 500 MG PO TABS
500.0000 mg | ORAL_TABLET | Freq: Three times a day (TID) | ORAL | Status: DC
Start: 2014-04-23 — End: 2014-06-23

## 2014-04-23 NOTE — ED Notes (Signed)
Lt abd pain ,onset last pm, hx of diverticulitis, says "that's what it feels like"  vomiting

## 2014-04-23 NOTE — Discharge Instructions (Signed)

## 2014-04-30 NOTE — ED Provider Notes (Signed)
CSN: [id number]     Arrival date & time 04/23/14  1426 History   First MD Initiated Contact with Patient 04/23/14 1823     Chief Complaint  Patient presents with  . Abdominal Pain     (Consider location/radiation/quality/duration/timing/severity/associated sxs/prior Treatment) HPI  [age]y male with abdominal pain. Gradual onset last night and progressively worsening. Pain is in the left side. Fairly constant. Waxes and wanes without appreciable exacerbating relieving factors. Mild nausea. Vomiting 1. Loose stools but not quite diarrhea. No blood in it. Patient has a past history of diverticulitis. States that current symptoms feel similar. No urinary complaints. No fevers or chills. History reviewed. No pertinent past medical history. History reviewed. No pertinent past surgical history. History reviewed. No pertinent family history. History  Substance Use Topics  . Smoking status: Current Every Day Smoker -- 1.00 packs/day    Types: Cigarettes  . Smokeless tobacco: Not on file  . Alcohol Use: Yes     Comment: on weekends    Review of Systems  All systems reviewed and negative, other than as noted in HPI.   Allergies  Review of patient's allergies indicates no known allergies.  Home Medications   Prior to Admission medications   Medication Sig Start Date End Date Taking? Authorizing Provider  acetaminophen (TYLENOL) 500 MG tablet Take 500-1,000 mg by mouth every 6 (six) hours as needed for mild pain or moderate pain.   Yes Historical Provider, MD  calcium carbonate (TUMS - DOSED IN MG ELEMENTAL CALCIUM) 500 MG chewable tablet Chew 3 tablets by mouth daily as needed for indigestion or heartburn.   Yes Historical Provider, MD  ciprofloxacin (CIPRO) 500 MG tablet Take 1 tablet (500 mg total) by mouth every 12 (twelve) hours. 04/23/14   Raeford RazorStephen Kirtan Sada, MD  metroNIDAZOLE (FLAGYL) 500 MG tablet Take 1 tablet (500 mg total) by mouth 3 (three) times daily. 04/23/14   Raeford RazorStephen Nakaila Freeze, MD   oxyCODONE-acetaminophen (PERCOCET) 5-325 MG per tablet Take 1-2 tablets by mouth every 4 (four) hours as needed. Patient not taking: Reported on 04/23/2014 10/28/13   Donnetta HutchingBrian Cook, MD  oxyCODONE-acetaminophen (PERCOCET/ROXICET) 5-325 MG per tablet Take 1-2 tablets by mouth every 4 (four) hours as needed. 04/23/14   Raeford RazorStephen Burna Atlas, MD   BP 104/83 mmHg  Pulse 65  Temp(Src) 97.9 F (36.6 C) (Oral)  Resp 18  Ht 5\' 7"  (1.702 m)  Wt 200 lb (90.719 kg)  BMI 31.32 kg/m2  SpO2 97% Physical Exam  Constitutional: He appears well-developed and well-nourished. No distress.  HENT:  Head: Normocephalic and atraumatic.  Eyes: Conjunctivae are normal. Right eye exhibits no discharge. Left eye exhibits no discharge.  Neck: Neck supple.  Cardiovascular: Normal rate, regular rhythm and normal heart sounds.  Exam reveals no gallop and no friction rub.   No murmur heard. Pulmonary/Chest: Effort normal and breath sounds normal. No respiratory distress.  Abdominal: Soft. He exhibits no distension. There is tenderness.  Left-sided abdominal tenderness without rebound or guarding. No distention.  Musculoskeletal: He exhibits no edema or tenderness.  Neurological: He is alert.  Skin: Skin is warm and dry.  Psychiatric: He has a normal mood and affect. His behavior is normal. Thought content normal.  Nursing note and vitals reviewed.   ED Course  Procedures (including critical care time) Labs Review Labs Reviewed  CBC WITH DIFFERENTIAL/PLATELET - Abnormal; Notable for the following:    WBC 13.5 (*)    Platelets 419 (*)    Neutro Abs 9.3 (*)  Monocytes Absolute 1.1 (*)    All other components within normal limits  URINALYSIS, ROUTINE W REFLEX MICROSCOPIC - Abnormal; Notable for the following:    Specific Gravity, Urine >1.030 (*)    Bilirubin Urine SMALL (*)    All other components within normal limits  COMPREHENSIVE METABOLIC PANEL    Imaging Review No results found.   EKG Interpretation None       MDM   Final diagnoses:  Left sided abdominal pain    41 six-year-old male with left-sided abdominal pain. Has a past history of diverticulitis and states the current symptoms feel similar. Clinical picture is consistent with this. He does have some mild tenderness on exam, but no peritoneal signs. Discussed with him imaging versus presumptive treatment. Shared decision making and CT was deferred at this time. Will place on ciprofloxacin and metronidazole. As needed pain medication. Return precautions were discussed.  Raeford Razor, MD 04/30/14 1002

## 2014-06-23 ENCOUNTER — Emergency Department (HOSPITAL_COMMUNITY): Payer: Self-pay

## 2014-06-23 ENCOUNTER — Encounter (HOSPITAL_COMMUNITY): Payer: Self-pay

## 2014-06-23 ENCOUNTER — Emergency Department (HOSPITAL_COMMUNITY)
Admission: EM | Admit: 2014-06-23 | Discharge: 2014-06-23 | Disposition: A | Discharge status: Home / Self Care | Payer: Self-pay | Attending: Emergency Medicine | Admitting: Emergency Medicine

## 2014-06-23 DIAGNOSIS — Z720 Tobacco use: Secondary | ICD-10-CM | POA: Insufficient documentation

## 2014-06-23 DIAGNOSIS — K5732 Diverticulitis of large intestine without perforation or abscess without bleeding: Secondary | ICD-10-CM | POA: Insufficient documentation

## 2014-06-23 LAB — URINALYSIS, ROUTINE W REFLEX MICROSCOPIC
BILIRUBIN URINE: NEGATIVE
GLUCOSE, UA: NEGATIVE mg/dL
Ketones, ur: NEGATIVE mg/dL
Leukocytes, UA: NEGATIVE
Nitrite: NEGATIVE
PH: 5 (ref 5.0–8.0)
PROTEIN: NEGATIVE mg/dL
SPECIFIC GRAVITY, URINE: 1.01 (ref 1.005–1.030)
UROBILINOGEN UA: 0.2 mg/dL (ref 0.0–1.0)

## 2014-06-23 LAB — CBC WITH DIFFERENTIAL/PLATELET
Basophils Absolute: 0 10*3/uL (ref 0.0–0.1)
Basophils Relative: 0 % (ref 0–1)
EOS PCT: 1 % (ref 0–5)
Eosinophils Absolute: 0.1 10*3/uL (ref 0.0–0.7)
HEMATOCRIT: 40 % (ref 39.0–52.0)
HEMOGLOBIN: 13.7 g/dL (ref 13.0–17.0)
LYMPHS ABS: 2.2 10*3/uL (ref 0.7–4.0)
LYMPHS PCT: 17 % (ref 12–46)
MCH: 30.3 pg (ref 26.0–34.0)
MCHC: 34.3 g/dL (ref 30.0–36.0)
MCV: 88.5 fL (ref 78.0–100.0)
MONO ABS: 1 10*3/uL (ref 0.1–1.0)
MONOS PCT: 7 % (ref 3–12)
Neutro Abs: 9.9 10*3/uL — ABNORMAL HIGH (ref 1.7–7.7)
Neutrophils Relative %: 75 % (ref 43–77)
PLATELETS: 321 10*3/uL (ref 150–400)
RBC: 4.52 MIL/uL (ref 4.22–5.81)
RDW: 14.4 % (ref 11.5–15.5)
WBC: 13.2 10*3/uL — AB (ref 4.0–10.5)

## 2014-06-23 LAB — COMPREHENSIVE METABOLIC PANEL
ALK PHOS: 60 U/L (ref 38–126)
ALT: 17 U/L (ref 17–63)
AST: 17 U/L (ref 15–41)
Albumin: 4 g/dL (ref 3.5–5.0)
Anion gap: 8 (ref 5–15)
BUN: 7 mg/dL (ref 6–20)
CO2: 24 mmol/L (ref 22–32)
Calcium: 9.5 mg/dL (ref 8.9–10.3)
Chloride: 105 mmol/L (ref 101–111)
Creatinine, Ser: 0.8 mg/dL (ref 0.61–1.24)
GFR calc Af Amer: >60 mL/min (ref >60)
GFR calc non Af Amer: >60 mL/min (ref >60)
GLUCOSE: 91 mg/dL (ref 70–99)
POTASSIUM: 3.3 mmol/L — AB (ref 3.5–5.1)
SODIUM: 137 mmol/L (ref 135–145)
TOTAL PROTEIN: 7.4 g/dL (ref 6.5–8.1)
Total Bilirubin: 0.6 mg/dL (ref 0.3–1.2)

## 2014-06-23 LAB — URINE MICROSCOPIC-ADD ON

## 2014-06-23 MED ORDER — OXYCODONE-ACETAMINOPHEN 5-325 MG PO TABS: 1.0000 | ORAL_TABLET | Freq: Four times a day (QID) | ORAL | Status: DC | PRN

## 2014-06-23 MED ORDER — OXYCODONE-ACETAMINOPHEN 5-325 MG PO TABS
1.0000 | ORAL_TABLET | Freq: Once | ORAL | Status: AC
Start: 2014-06-23 — End: 2014-06-23
  Administered 2014-06-23: 1 via ORAL
  Filled 2014-06-23: qty 1

## 2014-06-23 MED ORDER — METRONIDAZOLE 500 MG PO TABS
500.0000 mg | ORAL_TABLET | Freq: Once | ORAL | Status: AC
  Administered 2014-06-23: 500 mg via ORAL
  Filled 2014-06-23: qty 1

## 2014-06-23 MED ORDER — HYDROMORPHONE HCL 1 MG/ML IJ SOLN
1.0000 mg | Freq: Once | INTRAMUSCULAR | Status: AC
  Administered 2014-06-23: 1 mg via INTRAVENOUS
  Filled 2014-06-23: qty 1

## 2014-06-23 MED ORDER — CIPROFLOXACIN HCL 500 MG PO TABS: 500.0000 mg | ORAL_TABLET | Freq: Two times a day (BID) | ORAL | Status: DC

## 2014-06-23 MED ORDER — CIPROFLOXACIN HCL 250 MG PO TABS
500.0000 mg | ORAL_TABLET | Freq: Once | ORAL | Status: AC
  Administered 2014-06-23: 500 mg via ORAL
  Filled 2014-06-23: qty 2

## 2014-06-23 MED ORDER — METRONIDAZOLE 500 MG PO TABS: 500.0000 mg | ORAL_TABLET | Freq: Two times a day (BID) | ORAL | Status: DC

## 2014-06-23 MED ORDER — IOHEXOL 300 MG/ML  SOLN
100.0000 mL | Freq: Once | INTRAMUSCULAR | Status: AC | PRN
  Administered 2014-06-23: 100 mL via INTRAVENOUS

## 2014-06-23 MED ORDER — OXYCODONE-ACETAMINOPHEN 5-325 MG PO TABS
1.0000 | ORAL_TABLET | Freq: Once | ORAL | Status: AC
  Administered 2014-06-23: 1 via ORAL
  Filled 2014-06-23: qty 1

## 2014-06-23 NOTE — Discharge Instructions (Signed)

## 2014-06-23 NOTE — ED Notes (Signed)
Advised not to drive after taking pain meds, states he will leave his car here

## 2014-06-23 NOTE — ED Provider Notes (Signed)
CSN: [id number]     Arrival date & time 06/23/14  0408 History   First MD Initiated Contact with Patient 06/23/14 [phone]     Chief Complaint  Patient presents with  . Abdominal Pain      HPI Patient reports several days of worsening left lower quadrant abdominal pain.  He has a history of diverticulitis and states this feels similar.  He denies nausea or vomiting.  No diarrhea.  No fevers or chills.  He reports pain is worse with movement and palpation of his left abdomen.  Denies back pain or flank pain.  No urinary complaints.  Symptoms are moderate to severe in severity.   Past medical history: History of diverticulitis Past surgical history: None Family history: Noncontributory History  Substance Use Topics  . Smoking status: Current Every Day Smoker -- 1.00 packs/day    Types: Cigarettes  . Smokeless tobacco: Not on file  . Alcohol Use: Yes     Comment: on weekends    Review of Systems  All other systems reviewed and are negative.     Allergies  Review of patient's allergies indicates no known allergies.  Home Medications   Prior to Admission medications   Medication Sig Start Date End Date Taking? Authorizing Provider  ciprofloxacin (CIPRO) 500 MG tablet Take 1 tablet (500 mg total) by mouth every 12 (twelve) hours. 06/23/14   Azalia BilisKevin Grisel Blumenstock, MD  metroNIDAZOLE (FLAGYL) 500 MG tablet Take 1 tablet (500 mg total) by mouth 2 (two) times daily. 06/23/14   Azalia BilisKevin Marqueta Pulley, MD  oxyCODONE-acetaminophen (PERCOCET/ROXICET) 5-325 MG per tablet Take 1 tablet by mouth every 6 (six) hours as needed for severe pain. 06/23/14   Azalia BilisKevin Brigett Estell, MD   BP 136/98 mmHg  Pulse 66  Temp(Src) 98.2 F (36.8 C)  Resp 18  Ht 5\' 7"  (1.702 m)  Wt 190 lb (86.183 kg)  BMI 29.75 kg/m2  SpO2 98% Physical Exam  Constitutional: He is oriented to person, place, and time. He appears well-developed and well-nourished.  HENT:  Head: Normocephalic and atraumatic.  Eyes: EOM are normal.  Neck: Normal range of  motion.  Cardiovascular: Normal rate, regular rhythm, normal heart sounds and intact distal pulses.   Pulmonary/Chest: Effort normal and breath sounds normal. No respiratory distress.  Abdominal: Soft. He exhibits no distension. There is no tenderness.  Musculoskeletal: Normal range of motion.  Neurological: He is alert and oriented to person, place, and time.  Skin: Skin is warm and dry.  Psychiatric: He has a normal mood and affect. Judgment normal.  Nursing note and vitals reviewed.   ED Course  Procedures (including critical care time) Labs Review Labs Reviewed  CBC WITH DIFFERENTIAL/PLATELET - Abnormal; Notable for the following:    WBC 13.2 (*)    Neutro Abs 9.9 (*)    All other components within normal limits  COMPREHENSIVE METABOLIC PANEL - Abnormal; Notable for the following:    Potassium 3.3 (*)    All other components within normal limits  URINALYSIS, ROUTINE W REFLEX MICROSCOPIC - Abnormal; Notable for the following:    Hgb urine dipstick SMALL (*)    All other components within normal limits  URINE MICROSCOPIC-ADD ON    Imaging Review Ct Abdomen Pelvis W Contrast  06/23/2014   CLINICAL DATA:  Left lower abdominal pain for 3 days.  EXAM: CT ABDOMEN AND PELVIS WITH CONTRAST  TECHNIQUE: Multidetector CT imaging of the abdomen and pelvis was performed using the standard protocol following bolus administration of intravenous contrast.  CONTRAST:  100mL OMNIPAQUE IOHEXOL 300 MG/ML  SOLN  COMPARISON:  05/19/2011  FINDINGS: There is acute inflammatory change surrounding a diverticulum of the proximal sigmoid colon with appearances typical of acute diverticulitis. There is no abscess. There is no extraluminal air.  There are normal appearances of the liver, spleen, pancreas, adrenals and kidneys.  No other acute findings are evident in the abdomen or pelvis. There is no ascites. There is no adenopathy.  The abdominal aorta is normal in caliber.  There is no significant abnormality in  the lower chest. There is no significant musculoskeletal abnormality.  IMPRESSION: Acute diverticulitis, proximal sigmoid colon.   Electronically Signed   By: Ellery Plunkaniel R Mitchell M.D.   On: 06/23/2014 06:06  I personally reviewed the imaging tests through PACS system I reviewed available ER/hospitalization records through the EMR    EKG Interpretation None      MDM   Final diagnoses:  Diverticulitis of large intestine without perforation or abscess without bleeding    Acute diverticulitis without complication.  Home with Cipro Flagyl.  First dose in the emergency department.  He's feeling much better this time.    Azalia BilisKevin Ido Wollman, MD 06/23/14 [phone]

## 2014-06-23 NOTE — ED Notes (Signed)
Pt reports lower abd pain for a couple of days, states he has had some stool with mucus and blood in it.  Pt relates history of diverticulitis.

## 2014-11-06 ENCOUNTER — Emergency Department (HOSPITAL_COMMUNITY): Payer: Self-pay

## 2014-11-06 ENCOUNTER — Emergency Department (HOSPITAL_COMMUNITY)
Admission: EM | Admit: 2014-11-06 | Discharge: 2014-11-06 | Disposition: A | Discharge status: Home / Self Care | Payer: Self-pay | Attending: Emergency Medicine | Admitting: Emergency Medicine

## 2014-11-06 ENCOUNTER — Encounter (HOSPITAL_COMMUNITY): Payer: Self-pay | Admitting: Emergency Medicine

## 2014-11-06 DIAGNOSIS — S59912A Unspecified injury of left forearm, initial encounter: Secondary | ICD-10-CM | POA: Insufficient documentation

## 2014-11-06 DIAGNOSIS — S8992XA Unspecified injury of left lower leg, initial encounter: Secondary | ICD-10-CM | POA: Insufficient documentation

## 2014-11-06 DIAGNOSIS — S59911A Unspecified injury of right forearm, initial encounter: Secondary | ICD-10-CM | POA: Insufficient documentation

## 2014-11-06 DIAGNOSIS — Z79899 Other long term (current) drug therapy: Secondary | ICD-10-CM | POA: Insufficient documentation

## 2014-11-06 DIAGNOSIS — Z720 Tobacco use: Secondary | ICD-10-CM | POA: Insufficient documentation

## 2014-11-06 DIAGNOSIS — T1590XA Foreign body on external eye, part unspecified, unspecified eye, initial encounter: Secondary | ICD-10-CM

## 2014-11-06 DIAGNOSIS — S199XXA Unspecified injury of neck, initial encounter: Secondary | ICD-10-CM | POA: Insufficient documentation

## 2014-11-06 DIAGNOSIS — R202 Paresthesia of skin: Secondary | ICD-10-CM | POA: Insufficient documentation

## 2014-11-06 DIAGNOSIS — Y998 Other external cause status: Secondary | ICD-10-CM | POA: Insufficient documentation

## 2014-11-06 DIAGNOSIS — S0990XA Unspecified injury of head, initial encounter: Secondary | ICD-10-CM | POA: Insufficient documentation

## 2014-11-06 DIAGNOSIS — Y9389 Activity, other specified: Secondary | ICD-10-CM | POA: Insufficient documentation

## 2014-11-06 DIAGNOSIS — Y9289 Other specified places as the place of occurrence of the external cause: Secondary | ICD-10-CM | POA: Insufficient documentation

## 2014-11-06 MED ORDER — HYDROCODONE-ACETAMINOPHEN 5-325 MG PO TABS: 2.0000 | ORAL_TABLET | ORAL | Status: DC | PRN

## 2014-11-06 MED ORDER — HYDROMORPHONE HCL 1 MG/ML IJ SOLN
1.0000 mg | INTRAMUSCULAR | Status: DC | PRN
  Administered 2014-11-06: 1 mg via INTRAVENOUS
  Filled 2014-11-06: qty 1

## 2014-11-06 MED ORDER — HYDROMORPHONE HCL 1 MG/ML IJ SOLN
INTRAMUSCULAR | Status: AC
  Administered 2014-11-06: 1 mg via INTRAVENOUS
  Filled 2014-11-06: qty 1

## 2014-11-06 MED ORDER — HYDROMORPHONE HCL 1 MG/ML IJ SOLN
0.5000 mg | Freq: Once | INTRAMUSCULAR | Status: AC
  Administered 2014-11-06: 0.5 mg via INTRAVENOUS

## 2014-11-06 MED ORDER — ONDANSETRON HCL 4 MG/2ML IJ SOLN
4.0000 mg | Freq: Once | INTRAMUSCULAR | Status: AC
  Administered 2014-11-06: 4 mg via INTRAVENOUS

## 2014-11-06 MED ORDER — METHOCARBAMOL 500 MG PO TABS: 500.0000 mg | ORAL_TABLET | Freq: Three times a day (TID) | ORAL | Status: DC | PRN

## 2014-11-06 MED ORDER — MORPHINE SULFATE (PF) 4 MG/ML IV SOLN
4.0000 mg | Freq: Once | INTRAVENOUS | Status: AC
  Administered 2014-11-06: 4 mg via INTRAVENOUS

## 2014-11-06 MED ORDER — NAPROXEN 500 MG PO TABS: 500.0000 mg | ORAL_TABLET | Freq: Two times a day (BID) | ORAL | Status: DC

## 2014-11-06 NOTE — Discharge Instructions (Signed)
Recheck with orthopedics, Dr. Romeo Apple if not bearing weight on your knee in 1 week. If the strange feelings in your arms do not resolve please check with Dr. Gerilyn Pilgrim.  Head Injury You have a head injury. Headaches and throwing up (vomiting) are common after a head injury. It should be easy to wake up from sleeping. Sometimes you must stay in the hospital. Most problems happen within the first 24 hours. Side effects may occur up to 7-10 days after the injury.  WHAT ARE THE TYPES OF HEAD INJURIES? Head injuries can be as minor as a bump. Some head injuries can be more severe. More severe head injuries include:  A jarring injury to the brain (concussion).  A bruise of the brain (contusion). This mean there is bleeding in the brain that can cause swelling.  A cracked skull (skull fracture).  Bleeding in the brain that collects, clots, and forms a bump (hematoma). WHEN SHOULD I GET HELP RIGHT AWAY?   You are confused or sleepy.  You cannot be woken up.  You feel sick to your stomach (nauseous) or keep throwing up (vomiting).  Your dizziness or unsteadiness is getting worse.  You have very bad, lasting headaches that are not helped by medicine. Take medicines only as told by your doctor.  You cannot use your arms or legs like normal.  You cannot walk.  You notice changes in the black spots in the center of the colored part of your eye (pupil).  You have clear or bloody fluid coming from your nose or ears.  You have trouble seeing. During the next 24 hours after the injury, you must stay with someone who can watch you. This person should get help right away (call 911 in the [place] if you start to shake and are not able to control it (have seizures), you pass out, or you are unable to wake up. HOW CAN I PREVENT A HEAD INJURY IN THE FUTURE?  Wear seat belts.  Wear a helmet while bike riding and playing sports like football.  Stay away from dangerous activities around the  house. WHEN CAN I RETURN TO NORMAL ACTIVITIES AND ATHLETICS? See your doctor before doing these activities. You should not do normal activities or play contact sports until 1 week after the following symptoms have stopped:  Headache that does not go away.  Dizziness.  Poor attention.  Confusion.  Memory problems.  Sickness to your stomach or throwing up.  Tiredness.  Fussiness.  Bothered by bright lights or loud noises.  Anxiousness or depression.  Restless sleep. MAKE SURE YOU:   Understand these instructions.  Will watch your condition.  Will get help right away if you are not doing well or get worse. Document Released: 01/16/2008 Document Revised: 06/19/2013 Document Reviewed: 10/10/2012 [facility] Patient Information 2015 [place], [place]. This information is not intended to replace advice given to you by your health care provider. Make sure you discuss any questions you have with your health care provider.  Paresthesia Paresthesia is an abnormal burning or prickling sensation. This sensation is generally felt in the hands, arms, legs, or feet. However, it may occur in any part of the body. It is usually not painful. The feeling may be described as:  Tingling or numbness.  "Pins and needles."  Skin crawling.  Buzzing.  Limbs "falling asleep."  Itching. Most people experience temporary (transient) paresthesia at some time in their lives. CAUSES  Paresthesia may occur when you breathe too quickly (hyperventilation). It can also  occur without any apparent cause. Commonly, paresthesia occurs when pressure is placed on a nerve. The feeling quickly goes away once the pressure is removed. For some people, however, paresthesia is a long-lasting (chronic) condition caused by an underlying disorder. The underlying disorder may be:  A traumatic, direct injury to nerves. Examples include a:  Broken (fractured) neck.  Fractured skull.  A disorder affecting the brain and  spinal cord (central nervous system). Examples include:  Transverse myelitis.  Encephalitis.  Transient ischemic attack.  Multiple sclerosis.  Stroke.  Tumor or blood vessel problems, such as an arteriovenous malformation pressing against the brain or spinal cord.  A condition that damages the peripheral nerves (peripheral neuropathy). Peripheral nerves are not part of the brain and spinal cord. These conditions include:  Diabetes.  Peripheral vascular disease.  Nerve entrapment syndromes, such as carpal tunnel syndrome.  Shingles.  Hypothyroidism.  Vitamin B12 deficiencies.  Alcoholism.  Heavy metal poisoning (lead, arsenic).  Rheumatoid arthritis.  Systemic lupus erythematosus. DIAGNOSIS  Your caregiver will attempt to find the underlying cause of your paresthesia. Your caregiver may:  Take your medical history.  Perform a physical exam.  Order various lab tests.  Order imaging tests. TREATMENT  Treatment for paresthesia depends on the underlying cause. HOME CARE INSTRUCTIONS  Avoid drinking alcohol.  You may consider massage or acupuncture to help relieve your symptoms.  Keep all follow-up appointments as directed by your caregiver. SEEK IMMEDIATE MEDICAL CARE IF:   You feel weak.  You have trouble walking or moving.  You have problems with speech or vision.  You feel confused.  You cannot control your bladder or bowel movements.  You feel numbness after an injury.  You faint.  Your burning or prickling feeling gets worse when walking.  You have pain, cramps, or dizziness.  You develop a rash. MAKE SURE YOU:  Understand these instructions.  Will watch your condition.  Will get help right away if you are not doing well or get worse. Document Released: 01/23/2002 Document Revised: 04/27/2011 Document Reviewed: 10/24/2010 [facility] Patient Information 2015 [place], [place]. This information is not intended to replace advice given to you  by your health care provider. Make sure you discuss any questions you have with your health care provider.

## 2014-11-06 NOTE — ED Notes (Signed)
Unable to assess for medication response until pt returned from MRI.
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
## 2014-11-06 NOTE — ED Notes (Signed)
Signature pad not working. 

## 2015-01-19 ENCOUNTER — Encounter (HOSPITAL_COMMUNITY): Payer: Self-pay | Admitting: Emergency Medicine

## 2015-01-19 ENCOUNTER — Emergency Department (HOSPITAL_COMMUNITY)
Admission: EM | Admit: 2015-01-19 | Discharge: 2015-01-19 | Disposition: A | Discharge status: Home / Self Care | Payer: Self-pay | Attending: Emergency Medicine | Admitting: Emergency Medicine

## 2015-01-19 ENCOUNTER — Emergency Department (HOSPITAL_COMMUNITY): Payer: Self-pay

## 2015-01-19 DIAGNOSIS — Y9289 Other specified places as the place of occurrence of the external cause: Secondary | ICD-10-CM | POA: Insufficient documentation

## 2015-01-19 DIAGNOSIS — L02413 Cutaneous abscess of right upper limb: Secondary | ICD-10-CM | POA: Insufficient documentation

## 2015-01-19 DIAGNOSIS — Y9389 Activity, other specified: Secondary | ICD-10-CM | POA: Insufficient documentation

## 2015-01-19 DIAGNOSIS — F17210 Nicotine dependence, cigarettes, uncomplicated: Secondary | ICD-10-CM | POA: Insufficient documentation

## 2015-01-19 DIAGNOSIS — L0291 Cutaneous abscess, unspecified: Secondary | ICD-10-CM

## 2015-01-19 DIAGNOSIS — Y990 Civilian activity done for income or pay: Secondary | ICD-10-CM | POA: Insufficient documentation

## 2015-01-19 DIAGNOSIS — W228XXA Striking against or struck by other objects, initial encounter: Secondary | ICD-10-CM | POA: Insufficient documentation

## 2015-01-19 MED ORDER — SULFAMETHOXAZOLE-TRIMETHOPRIM 800-160 MG PO TABS: 1.0000 | ORAL_TABLET | Freq: Two times a day (BID) | ORAL | Status: AC

## 2015-01-19 MED ORDER — HYDROMORPHONE HCL 1 MG/ML IJ SOLN
1.0000 mg | Freq: Once | INTRAMUSCULAR | Status: AC
  Administered 2015-01-19: 1 mg via INTRAMUSCULAR
  Filled 2015-01-19: qty 1

## 2015-01-19 MED ORDER — SULFAMETHOXAZOLE-TRIMETHOPRIM 800-160 MG PO TABS
1.0000 | ORAL_TABLET | Freq: Once | ORAL | Status: AC
  Administered 2015-01-19: 1 via ORAL
  Filled 2015-01-19: qty 1

## 2015-01-19 MED ORDER — OXYCODONE-ACETAMINOPHEN 5-325 MG PO TABS: 1.0000 | ORAL_TABLET | ORAL | Status: DC | PRN

## 2015-01-19 MED ORDER — LIDOCAINE HCL (PF) 1 % IJ SOLN
5.0000 mL | Freq: Once | INTRAMUSCULAR | Status: AC
  Administered 2015-01-19: 5 mL via INTRADERMAL
  Filled 2015-01-19: qty 5

## 2015-01-19 MED ORDER — LORAZEPAM 1 MG PO TABS
1.0000 mg | ORAL_TABLET | Freq: Once | ORAL | Status: AC
  Administered 2015-01-19: 1 mg via ORAL
  Filled 2015-01-19: qty 1

## 2015-01-19 MED ORDER — IBUPROFEN 400 MG PO TABS
600.0000 mg | ORAL_TABLET | Freq: Once | ORAL | Status: AC
  Administered 2015-01-19: 600 mg via ORAL
  Filled 2015-01-19: qty 2

## 2015-01-19 NOTE — ED Notes (Signed)
Pt states that he had a piece of metal go into his right bicep area last week and now is very painful, unable to straighten arm, and swollen.

## 2015-01-19 NOTE — Discharge Instructions (Signed)
Incision and Drainage Incision and drainage is a procedure in which a sac-like structure (cystic structure) is opened and drained. The area to be drained usually contains material such as pus, fluid, or blood.  LET YOUR CAREGIVER KNOW ABOUT:   Allergies to medicine.  Medicines taken, including vitamins, herbs, eyedrops, over-the-counter medicines, and creams.  Use of steroids (by mouth or creams).  Previous problems with anesthetics or numbing medicines.  History of bleeding problems or blood clots.  Previous surgery.  Other health problems, including diabetes and kidney problems.  Possibility of pregnancy, if this applies. RISKS AND COMPLICATIONS  Pain.  Bleeding.  Scarring.  Infection. BEFORE THE PROCEDURE  You may need to have an ultrasound or other imaging tests to see how large or deep your cystic structure is. Blood tests may also be used to determine if you have an infection or how severe the infection is. You may need to have a tetanus shot. PROCEDURE  The affected area is cleaned with a cleaning fluid. The cyst area will then be numbed with a medicine (local anesthetic). A small incision will be made in the cystic structure. A syringe or catheter may be used to drain the contents of the cystic structure, or the contents may be squeezed out. The area will then be flushed with a cleansing solution. After cleansing the area, it is often gently packed with a gauze or another wound dressing. Once it is packed, it will be covered with gauze and tape or some other type of wound dressing. AFTER THE PROCEDURE   Often, you will be allowed to go home right after the procedure.  You may be given antibiotic medicine to prevent or heal an infection.  If the area was packed with gauze or some other wound dressing, you will likely need to come back in 1 to 2 days to get it removed.  The area should heal in about 14 days.   This information is not intended to replace advice given  to you by your health care provider. Make sure you discuss any questions you have with your health care provider.   Document Released: 07/29/2000 Document Revised: 08/04/2011 Document Reviewed: 03/30/2011 Elsevier Interactive Patient Education 2016 [employer]  

## 2015-01-26 NOTE — ED Provider Notes (Signed)
CSN: [id number]     Arrival date & time 01/19/15  1325 History   First MD Initiated Contact with Patient 01/19/15 1423     Chief Complaint  Patient presents with  . Arm Injury     (Consider location/radiation/quality/duration/timing/severity/associated sxs/prior Treatment) HPI   39 year old male with increasing pain and swelling in his right antecubital fossa/distal bicep area. Patient reports injury at work about one week ago. He was cutting some metal shard impelled in this area. He felt like he removed it in its entirety. Since then he's been having increasing pain and swelling. No drainage. No fevers or chills. Denies any IV drug use.  History reviewed. No pertinent past medical history. History reviewed. No pertinent past surgical history. History reviewed. No pertinent family history. Social History  Substance Use Topics  . Smoking status: Current Every Day Smoker -- 1.00 packs/day    Types: Cigarettes  . Smokeless tobacco: Never Used  . Alcohol Use: Yes     Comment: on weekends    Review of Systems  All systems reviewed and negative, other than as noted in HPI.   Allergies  Review of patient's allergies indicates no known allergies.  Home Medications   Prior to Admission medications   Medication Sig Start Date End Date Taking? Authorizing Provider  ibuprofen (ADVIL,MOTRIN) 200 MG tablet Take 600 mg by mouth every 6 (six) hours as needed for moderate pain.   Yes Historical Provider, MD  ciprofloxacin (CIPRO) 500 MG tablet Take 1 tablet (500 mg total) by mouth every 12 (twelve) hours. Patient not taking: Reported on 11/06/2014 06/23/14   Azalia Bilis, MD  HYDROcodone-acetaminophen (NORCO/VICODIN) 5-325 MG per tablet Take 2 tablets by mouth every 4 (four) hours as needed. Patient not taking: Reported on 01/19/2015 11/06/14   Rolland Porter, MD  methocarbamol (ROBAXIN) 500 MG tablet Take 1 tablet (500 mg total) by mouth 3 (three) times daily between meals as needed. Patient not  taking: Reported on 01/19/2015 11/06/14   Rolland Porter, MD  metroNIDAZOLE (FLAGYL) 500 MG tablet Take 1 tablet (500 mg total) by mouth 2 (two) times daily. Patient not taking: Reported on 11/06/2014 06/23/14   Azalia Bilis, MD  naproxen (NAPROSYN) 500 MG tablet Take 1 tablet (500 mg total) by mouth 2 (two) times daily. Patient not taking: Reported on 01/19/2015 11/06/14   Rolland Porter, MD  oxyCODONE-acetaminophen (PERCOCET/ROXICET) 5-325 MG per tablet Take 1 tablet by mouth every 6 (six) hours as needed for severe pain. Patient not taking: Reported on 11/06/2014 06/23/14   Azalia Bilis, MD  oxyCODONE-acetaminophen (PERCOCET/ROXICET) 5-325 MG tablet Take 1 tablet by mouth every 4 (four) hours as needed for severe pain. 01/19/15   Raeford Razor, MD  sulfamethoxazole-trimethoprim (BACTRIM DS,SEPTRA DS) 800-160 MG tablet Take 1 tablet by mouth 2 (two) times daily. 01/19/15 01/26/15  Raeford Razor, MD   BP 131/83 mmHg  Pulse 61  Temp(Src) 97.8 F (36.6 C) (Oral)  Resp 17  Ht  (1.727 m)  Wt 195 lb (88.451 kg)  BMI 29.66 kg/m2  SpO2 100% Physical Exam  Constitutional: He appears well-developed and well-nourished. No distress.  HENT:  Head: Normocephalic and atraumatic.  Eyes: Conjunctivae are normal. Right eye exhibits no discharge. Left eye exhibits no discharge.  Neck: Neck supple.  Cardiovascular: Normal rate, regular rhythm and normal heart sounds.  Exam reveals no gallop and no friction rub.   No murmur heard. Pulmonary/Chest: Effort normal and breath sounds normal. No respiratory distress.  Abdominal: Soft. He exhibits no distension.  There is no tenderness.  Musculoskeletal: He exhibits no edema or tenderness.  Neurological: He is alert.  Skin: Skin is warm and dry.  Tender, erythematous and indurated area to right antecubital fossa. No fluctuance. Bedside ultrasound does show a fluid collection though. Mild surrounding cellulitic changes.   Psychiatric: He has a normal mood and affect. His  behavior is normal. Thought content normal.  Nursing note and vitals reviewed.   ED Course  Procedures (including critical care time)  INCISION AND DRAINAGE Performed by: Raeford RazorKOHUT, Levante Simones Consent: Verbal consent obtained. Risks and benefits: risks, benefits and alternatives were discussed Type: abscess  Body area: R antecubital fossa  Anesthesia: local infiltration  Incision was made with a scalpel.  Local anesthetic: lidocaine 1%  Anesthetic total: 3 ml  Complexity: complex Blunt dissection to break up loculations  Drainage: purulent  Drainage amount: moderate  Packing material: none  Patient tolerance: Patient tolerated the procedure well with no immediate complications.    Labs Review Labs Reviewed - No data to display  Imaging Review No results found. I have personally reviewed and evaluated these images and lab results as part of my medical decision-making.   EKG Interpretation None      MDM   Final diagnoses:  Abscess    39 year old male with an abscess to right antecubital fossa. Patient denies drug abuse. Reports that he had a short metal strike him in the same area shortly before symptom onset. Feels like it was removed and patency tired T. Extremity today without evidence of foreign body. Abscess was incised and drained without incident. There is a mild cellulitic component. He will be discharged with a course of antibiotics. Continued wound care return precautions were discussed.    Raeford RazorStephen Quinlan Vollmer, MD 01/26/15 [phone]

## 2015-02-13 ENCOUNTER — Encounter (HOSPITAL_COMMUNITY): Payer: Self-pay | Admitting: Emergency Medicine

## 2015-02-13 ENCOUNTER — Emergency Department (HOSPITAL_COMMUNITY)
Admission: EM | Admit: 2015-02-13 | Discharge: 2015-02-13 | Disposition: A | Discharge status: Home / Self Care | Payer: Self-pay | Attending: Emergency Medicine | Admitting: Emergency Medicine

## 2015-02-13 DIAGNOSIS — F17210 Nicotine dependence, cigarettes, uncomplicated: Secondary | ICD-10-CM | POA: Insufficient documentation

## 2015-02-13 DIAGNOSIS — L0291 Cutaneous abscess, unspecified: Secondary | ICD-10-CM

## 2015-02-13 DIAGNOSIS — L02414 Cutaneous abscess of left upper limb: Secondary | ICD-10-CM | POA: Insufficient documentation

## 2015-02-13 MED ORDER — OXYCODONE-ACETAMINOPHEN 5-325 MG PO TABS
1.0000 | ORAL_TABLET | Freq: Once | ORAL | Status: AC
  Administered 2015-02-13: 1 via ORAL
  Filled 2015-02-13: qty 1

## 2015-02-13 MED ORDER — SULFAMETHOXAZOLE-TRIMETHOPRIM 800-160 MG PO TABS: 1.0000 | ORAL_TABLET | Freq: Two times a day (BID) | ORAL | Status: AC

## 2015-02-13 MED ORDER — CEPHALEXIN 500 MG PO CAPS: 500.0000 mg | ORAL_CAPSULE | Freq: Four times a day (QID) | ORAL | Status: DC

## 2015-02-13 MED ORDER — OXYCODONE-ACETAMINOPHEN 5-325 MG PO TABS: 1.0000 | ORAL_TABLET | ORAL | Status: DC | PRN

## 2015-02-13 MED ORDER — CEPHALEXIN 500 MG PO CAPS
500.0000 mg | ORAL_CAPSULE | Freq: Once | ORAL | Status: AC
  Administered 2015-02-13: 500 mg via ORAL
  Filled 2015-02-13: qty 1

## 2015-02-13 MED ORDER — LIDOCAINE HCL (PF) 1 % IJ SOLN
5.0000 mL | Freq: Once | INTRAMUSCULAR | Status: AC
  Administered 2015-02-13: 5 mL via INTRADERMAL
  Filled 2015-02-13: qty 5

## 2015-02-13 MED ORDER — SULFAMETHOXAZOLE-TRIMETHOPRIM 800-160 MG PO TABS
1.0000 | ORAL_TABLET | Freq: Once | ORAL | Status: AC
  Administered 2015-02-13: 1 via ORAL
  Filled 2015-02-13: qty 1

## 2015-02-13 NOTE — ED Provider Notes (Signed)
CSN: [id number]     Arrival date & time 02/13/15  4098 History   First MD Initiated Contact with Patient 02/13/15 [phone]     Chief Complaint  Patient presents with  . Abscess     (Consider location/radiation/quality/duration/timing/severity/associated sxs/prior Treatment) HPI   Steven Skinner is a 39 y.o. male who presents to the Emergency Department complaining of redness, swelling and pain to the mid left forearm.  He states that he was grinding metal 3 weeks ago and had multiple shards of metal puncture his skin.  He was seen here on 01/19/15 after removing a piece of metal to the right arm that developed into an abscess.  He states the area to the left arm did not hurt at the time of the previous visit, but has became red, and painful about a week ago.  Pain is worse with palpation.  He also reports associated nausea and generalized body aches.  He denies fever, chills, vomiting, red steaks, or pain distal to the abscess site.  He denies IV drug use.    History reviewed. No pertinent past medical history. History reviewed. No pertinent past surgical history. History reviewed. No pertinent family history. Social History  Substance Use Topics  . Smoking status: Current Every Day Smoker -- 1.00 packs/day    Types: Cigarettes  . Smokeless tobacco: Never Used  . Alcohol Use: Yes     Comment: on weekends    Review of Systems  Constitutional: Negative for fever and chills.  Gastrointestinal: Negative for nausea and vomiting.  Musculoskeletal: Negative for joint swelling and arthralgias.  Skin: Positive for color change.       Abscess   Hematological: Negative for adenopathy.  All other systems reviewed and are negative.     Allergies  Review of patient's allergies indicates no known allergies.  Home Medications   Prior to Admission medications   Medication Sig Start Date End Date Taking? Authorizing Provider  ciprofloxacin (CIPRO) 500 MG tablet Take 1 tablet (500 mg total) by  mouth every 12 (twelve) hours. Patient not taking: Reported on 11/06/2014 06/23/14   Azalia Bilis, MD  HYDROcodone-acetaminophen (NORCO/VICODIN) 5-325 MG per tablet Take 2 tablets by mouth every 4 (four) hours as needed. Patient not taking: Reported on 01/19/2015 11/06/14   Rolland Porter, MD  ibuprofen (ADVIL,MOTRIN) 200 MG tablet Take 600 mg by mouth every 6 (six) hours as needed for moderate pain.    Historical Provider, MD  methocarbamol (ROBAXIN) 500 MG tablet Take 1 tablet (500 mg total) by mouth 3 (three) times daily between meals as needed. Patient not taking: Reported on 01/19/2015 11/06/14   Rolland Porter, MD  metroNIDAZOLE (FLAGYL) 500 MG tablet Take 1 tablet (500 mg total) by mouth 2 (two) times daily. Patient not taking: Reported on 11/06/2014 06/23/14   Azalia Bilis, MD  naproxen (NAPROSYN) 500 MG tablet Take 1 tablet (500 mg total) by mouth 2 (two) times daily. Patient not taking: Reported on 01/19/2015 11/06/14   Rolland Porter, MD  oxyCODONE-acetaminophen (PERCOCET/ROXICET) 5-325 MG per tablet Take 1 tablet by mouth every 6 (six) hours as needed for severe pain. Patient not taking: Reported on 11/06/2014 06/23/14   Azalia Bilis, MD  oxyCODONE-acetaminophen (PERCOCET/ROXICET) 5-325 MG tablet Take 1 tablet by mouth every 4 (four) hours as needed for severe pain. 01/19/15   Raeford Razor, MD   BP 123/92 mmHg  Pulse 85  Temp(Src) 97.6 F (36.4 C) (Oral)  Resp 16  Ht  (1.727 m)  Wt 81.647  kg  BMI 27.38 kg/m2  SpO2 100%   Physical Exam  Constitutional: He is oriented to person, place, and time. He appears well-developed and well-nourished. No distress.  HENT:  Head: Normocephalic and atraumatic.  Cardiovascular: Normal rate, regular rhythm and normal heart sounds.   No murmur heard. Pulmonary/Chest: Effort normal and breath sounds normal. No respiratory distress.  Musculoskeletal: He exhibits tenderness.  Pt has full ROM of the left forearm, wrist and fingers.  Radial pulse brisk, distal  sensation intact  Neurological: He is alert and oriented to person, place, and time. He exhibits normal muscle tone. Coordination normal.  Skin: Skin is warm and dry. There is erythema.  2.5 cm abscess to the left mid forearm with minimal surrounding erythema.  Mild fluctuance.  No red streaking.  Nursing note and vitals reviewed.   ED Course  Procedures (including critical care time) Labs Review Labs Reviewed - No data to display  Imaging Review No results found. I have personally reviewed and evaluated these images and lab results as part of my medical decision-making.   INCISION AND DRAINAGE Performed by: Maxwell CaulRIPLETT,Shahidah Nesbitt L. Consent: Verbal consent obtained. Risks and benefits: risks, benefits and alternatives were discussed Type: abscess  Body area: left forearm  Anesthesia: local infiltration  Incision was made with a #11  scalpel.  Local anesthetic: lidocaine 1 % w/o epinephrine  Anesthetic total: 3 ml  Complexity: complex Blunt dissection to break up loculations  Drainage: purulent  Drainage amount: moderate  Packing material: 1/2 in iodoform gauze  Patient tolerance: Patient tolerated the procedure well with no immediate complications.     MDM   Final diagnoses:  Abscess    Pt initially agreeable to I&D of the abscess, requesting pain medication prior to procedure.  Percocet given.  When I entered the room to start the procedure, pt jumped up from the stretcher and said "yall not squeezing on me without pain medicine."  Pt started to leave then decided  to stay and agreed to procedure.  Also agrees to warm soaks, packing removal in 2 days and close return for any worsening sx's.  Feeling better and stable for d/c     Pauline Ausammy Karrin Eisenmenger, PA-C 02/16/15 2110  Glynn OctaveStephen Rancour, MD 02/17/15 [phone]

## 2015-02-13 NOTE — ED Notes (Signed)
Patient states he had a piece of metal in bilateral arms approximately 3 weeks ago. States they lanced the right arm, but is complaining of pain and redness noted to left arm that started 2 weeks ago.

## 2015-02-13 NOTE — Discharge Instructions (Signed)
Abscess °An abscess (boil or furuncle) is an infected area on or under the skin. This area is filled with yellowish-white fluid (pus) and other material (debris). °HOME CARE  °· Only take medicines as told by your doctor. °· If you were given antibiotic medicine, take it as directed. Finish the medicine even if you start to feel better. °· If gauze is used, follow your doctor's directions for changing the gauze. °· To avoid spreading the infection: °¨ Keep your abscess covered with a bandage. °¨ Wash your hands well. °¨ Do not share personal care items, towels, or whirlpools with others. °¨ Avoid skin contact with others. °· Keep your skin and clothes clean around the abscess. °· Keep all doctor visits as told. °GET HELP RIGHT AWAY IF:  °· You have more pain, puffiness (swelling), or redness in the wound site. °· You have more fluid or blood coming from the wound site. °· You have muscle aches, chills, or you feel sick. °· You have a fever. °MAKE SURE YOU:  °· Understand these instructions. °· Will watch your condition. °· Will get help right away if you are not doing well or get worse. °  °This information is not intended to replace advice given to you by your health care provider. Make sure you discuss any questions you have with your health care provider. °  °Document Released: 07/22/2007 Document Revised: 08/04/2011 Document Reviewed: 04/18/2011 °Elsevier Interactive Patient Education ©2016 [employer] ° °

## 2015-07-01 ENCOUNTER — Encounter (HOSPITAL_COMMUNITY): Payer: Self-pay | Admitting: Emergency Medicine

## 2015-07-01 ENCOUNTER — Emergency Department (HOSPITAL_COMMUNITY)
Admission: EM | Admit: 2015-07-01 | Discharge: 2015-07-02 | Disposition: A | Discharge status: Home / Self Care | Payer: Self-pay | Attending: Emergency Medicine | Admitting: Emergency Medicine

## 2015-07-01 ENCOUNTER — Emergency Department (HOSPITAL_COMMUNITY): Payer: Self-pay

## 2015-07-01 DIAGNOSIS — F17210 Nicotine dependence, cigarettes, uncomplicated: Secondary | ICD-10-CM | POA: Insufficient documentation

## 2015-07-01 DIAGNOSIS — R42 Dizziness and giddiness: Secondary | ICD-10-CM | POA: Insufficient documentation

## 2015-07-01 DIAGNOSIS — Z79899 Other long term (current) drug therapy: Secondary | ICD-10-CM | POA: Insufficient documentation

## 2015-07-01 DIAGNOSIS — R112 Nausea with vomiting, unspecified: Secondary | ICD-10-CM | POA: Insufficient documentation

## 2015-07-01 DIAGNOSIS — L0291 Cutaneous abscess, unspecified: Secondary | ICD-10-CM

## 2015-07-01 DIAGNOSIS — L02512 Cutaneous abscess of left hand: Secondary | ICD-10-CM | POA: Insufficient documentation

## 2015-07-01 LAB — COMPREHENSIVE METABOLIC PANEL
ALK PHOS: 50 U/L (ref 38–126)
ALT: 27 U/L (ref 17–63)
ANION GAP: 6 (ref 5–15)
AST: 24 U/L (ref 15–41)
Albumin: 3.4 g/dL — ABNORMAL LOW (ref 3.5–5.0)
BILIRUBIN TOTAL: 0.4 mg/dL (ref 0.3–1.2)
BUN: 11 mg/dL (ref 6–20)
CALCIUM: 9 mg/dL (ref 8.9–10.3)
CO2: 25 mmol/L (ref 22–32)
Chloride: 109 mmol/L (ref 101–111)
Creatinine, Ser: 0.74 mg/dL (ref 0.61–1.24)
GFR calc non Af Amer: >60 mL/min (ref >60)
Glucose, Bld: 88 mg/dL (ref 65–99)
Potassium: 3.2 mmol/L — ABNORMAL LOW (ref 3.5–5.1)
Sodium: 140 mmol/L (ref 135–145)
TOTAL PROTEIN: 7.1 g/dL (ref 6.5–8.1)

## 2015-07-01 LAB — CBC WITH DIFFERENTIAL/PLATELET
Basophils Absolute: 0.1 10*3/uL (ref 0.0–0.1)
Basophils Relative: 1 %
EOS ABS: 0.2 10*3/uL (ref 0.0–0.7)
Eosinophils Relative: 3 %
HEMATOCRIT: 36.5 % — AB (ref 39.0–52.0)
HEMOGLOBIN: 12.5 g/dL — AB (ref 13.0–17.0)
LYMPHS ABS: 2.7 10*3/uL (ref 0.7–4.0)
Lymphocytes Relative: 34 %
MCH: 30.6 pg (ref 26.0–34.0)
MCHC: 34.2 g/dL (ref 30.0–36.0)
MCV: 89.2 fL (ref 78.0–100.0)
MONO ABS: 0.5 10*3/uL (ref 0.1–1.0)
MONOS PCT: 6 %
NEUTROS ABS: 4.4 10*3/uL (ref 1.7–7.7)
NEUTROS PCT: 56 %
Platelets: 346 10*3/uL (ref 150–400)
RBC: 4.09 MIL/uL — ABNORMAL LOW (ref 4.22–5.81)
RDW: 14.6 % (ref 11.5–15.5)
WBC: 7.8 10*3/uL (ref 4.0–10.5)

## 2015-07-01 MED ORDER — ACETAMINOPHEN 500 MG PO TABS
1000.0000 mg | ORAL_TABLET | Freq: Once | ORAL | Status: AC
  Administered 2015-07-01: 1000 mg via ORAL
  Filled 2015-07-01: qty 2

## 2015-07-01 MED ORDER — MORPHINE SULFATE (PF) 4 MG/ML IV SOLN
4.0000 mg | Freq: Once | INTRAVENOUS | Status: AC
  Administered 2015-07-01: 4 mg via INTRAVENOUS
  Filled 2015-07-01: qty 1

## 2015-07-01 MED ORDER — DOXYCYCLINE HYCLATE 100 MG PO CAPS: 100.0000 mg | ORAL_CAPSULE | Freq: Two times a day (BID) | ORAL | Status: AC

## 2015-07-01 MED ORDER — VANCOMYCIN HCL IN DEXTROSE 1-5 GM/200ML-% IV SOLN
1000.0000 mg | Freq: Once | INTRAVENOUS | Status: AC
Start: 2015-07-01 — End: 2015-07-02
  Administered 2015-07-01: 1000 mg via INTRAVENOUS
  Filled 2015-07-01: qty 200

## 2015-07-01 MED ORDER — SODIUM CHLORIDE 0.9 % IV BOLUS (SEPSIS)
1000.0000 mL | Freq: Once | INTRAVENOUS | Status: AC
  Administered 2015-07-01: 1000 mL via INTRAVENOUS

## 2015-07-01 MED ORDER — TETANUS-DIPHTH-ACELL PERTUSSIS 5-2.5-18.5 LF-MCG/0.5 IM SUSP
0.5000 mL | Freq: Once | INTRAMUSCULAR | Status: DC
  Filled 2015-07-01: qty 0.5

## 2015-07-01 MED ORDER — HYDROCODONE-ACETAMINOPHEN 5-325 MG PO TABS: 1.0000 | ORAL_TABLET | Freq: Four times a day (QID) | ORAL | Status: AC | PRN

## 2015-07-01 MED ORDER — LIDOCAINE HCL (PF) 1 % IJ SOLN
INTRAMUSCULAR | Status: AC
  Administered 2015-07-02: 5 mL
  Filled 2015-07-01: qty 5

## 2015-07-01 NOTE — ED Notes (Signed)
Patient complaining of abscess to left hand. States he thought he had a bee sting to hand on Thursday. Now complaining of swelling and drainage to left hand. Patient also complains of dizziness and feeling drowsy since yesterday. Patient stumbling in triage, denies using pain medication or drinking. Patient states "I was asleep in the waiting room, and it takes me a while to wake up. That's why I'm having trouble walking."

## 2015-07-01 NOTE — ED Notes (Signed)
Pt appears very drowsy and slow to follow commands at times.  Very unsteady on feet

## 2015-07-01 NOTE — ED Provider Notes (Signed)
CSN: [id number]     Arrival date & time 07/01/15  1842 History  By signing my name below, I, Marisue Humble, attest that this documentation has been prepared under the direction and in the presence of Bethann Berkshire, MD . Electronically Signed: Marisue Humble, Scribe. 07/01/2015. 10:03 PM.   Chief Complaint  Patient presents with  . Dizziness  . Abscess   Patient is a 40 y.o. male presenting with dizziness and abscess. The history is provided by the patient. No language interpreter was used.  Dizziness Duration:  1 day Chronicity:  New Associated symptoms: nausea and vomiting   Associated symptoms: no chest pain, no diarrhea and no headaches   Abscess Location:  Hand Hand abscess location:  L hand Abscess quality: draining, painful and redness   Duration:  5 days Progression:  Worsening Pain details:    Quality:  Sharp and burning   Severity:  Moderate   Duration:  5 days   Timing:  Constant   Progression:  Worsening Chronicity:  New Context: insect bite/sting   Ineffective treatments:  Draining/squeezing Associated symptoms: nausea and vomiting   Associated symptoms: no fatigue and no headaches    HPI Comments:  Steven Skinner is a 40 y.o. male who presents to the Emergency Department complaining of painful, draining abscess to left hand. Pt reports sudden onset burning pain in left hand onset 5 days ago; he thinks he was stung by a wasp. Pt woke up the next morning with swelling to his left hand which worsened over the next few days. He states he cut the abscess open and has experienced subsequent dizziness, vomiting, and sharp pain radiating up his left arm.  History reviewed. No pertinent past medical history. History reviewed. No pertinent past surgical history. History reviewed. No pertinent family history. Social History  Substance Use Topics  . Smoking status: Current Every Day Smoker -- 1.00 packs/day    Types: Cigarettes  . Smokeless tobacco: Never Used  .  Alcohol Use: Yes     Comment: on weekends    Review of Systems  Constitutional: Negative for appetite change and fatigue.  HENT: Negative for congestion, ear discharge and sinus pressure.   Eyes: Negative for discharge.  Respiratory: Negative for cough.   Cardiovascular: Negative for chest pain.  Gastrointestinal: Positive for nausea and vomiting. Negative for abdominal pain and diarrhea.  Genitourinary: Negative for frequency and hematuria.  Musculoskeletal: Negative for back pain.  Skin: Positive for wound. Negative for rash.  Neurological: Positive for dizziness. Negative for seizures and headaches.  Psychiatric/Behavioral: Negative for hallucinations.   Allergies  Review of patient's allergies indicates no known allergies.  Home Medications   Prior to Admission medications   Medication Sig Start Date End Date Taking? Authorizing Provider  acetaminophen (TYLENOL) 500 MG tablet Take 1,000 mg by mouth every 6 (six) hours as needed.   Yes Historical Provider, MD   BP 137/92 mmHg  Pulse 83  Temp(Src) 98.7 F (37.1 C) (Oral)  Resp 20  Ht  (1.702 m)  Wt 165 lb (74.844 kg)  BMI 25.84 kg/m2  SpO2 100% Physical Exam  Constitutional: He is oriented to person, place, and time. He appears well-developed.  HENT:  Head: Normocephalic.  Eyes: Conjunctivae are normal.  Neck: No tracheal deviation present.  Cardiovascular:  No murmur heard. Musculoskeletal: Normal range of motion.  Swelling to posterior part of left hand; swelling and redness proximal to 2nd metacarpal   Neurological: He is oriented to person, place, and  time.  Skin: Skin is warm.  Psychiatric: He has a normal mood and affect.    ED Course  Procedures  DIAGNOSTIC STUDIES:  Oxygen Saturation is 100% on RA, normal by my interpretation.    COORDINATION OF CARE:  9:57 PM Will order x-ray of left hand, CBC and CMP. Discussed treatment plan with pt at bedside and pt agreed to plan.  Labs Review Labs  Reviewed  CBC WITH DIFFERENTIAL/PLATELET  COMPREHENSIVE METABOLIC PANEL    Imaging Review No results found. I have personally reviewed and evaluated these images and lab results as part of my medical decision-making.   EKG Interpretation None      MDM   Final diagnoses:  None    Abscess left hand  The chart was scribed for me under my direct supervision.  I personally performed the history, physical, and medical decision making and all procedures in the evaluation of this patient.Bethann Berkshire.   Harleigh Civello, MD 07/09/15 [phone]

## 2015-07-01 NOTE — Discharge Instructions (Signed)
Follow up with dr Melvyn Novasortmann tomorrow at 2pm

## 2015-07-02 MED ORDER — HYDROCODONE-ACETAMINOPHEN 5-325 MG PO TABS
1.0000 | ORAL_TABLET | Freq: Once | ORAL | Status: AC
  Administered 2015-07-02: 1 via ORAL
  Filled 2015-07-02: qty 1

## 2015-07-02 NOTE — ED Notes (Signed)
Pt states understanding of care given and follow up instructions.  Ambulated from ED to waiting room to wait for ride home

## 2015-07-02 NOTE — ED Provider Notes (Signed)
THIS IS A SHARED VISIT WITH DR. Estell HarpinZAMMIT  Avanell ShackletonDustin L Skinner is a 40 y.o. male with an abscess to the dorsum of the left hand needing I&D.   BP 163/84 mmHg  Pulse 55  Temp(Src) 98.7 F (37.1 C) (Oral)  Resp 19  Ht 5\' 7"  (1.702 m)  Wt 74.844 kg  BMI 25.84 kg/m2  SpO2 99%   PROCEDURE NOTE: INCISION AND DRAINAGE Performed by: NEESE,HOPE Consent: Verbal consent obtained. Risks and benefits: risks, benefits and alternatives were discussed Type: abscess  Body area: dorsum of left hand  Wound prepped with betadine and draped with sterile towels  Anesthesia: local infiltration  Local anesthetic: lidocaine 1% without epinephrine  Anesthetic total: 3 ml  Incision made with #11 blade  Complexity: complex Blunt dissection to break up loculations  Drainage: bloody, purulent  Drainage amount: small  Packing material: 1/4 in iodoform gauze  Patient tolerance: Patient tolerated the procedure well with no immediate complications.  D/c instructions given by Dr. Estell HarpinZammit verbally and in writing.     [address]Hope GonzalesM Neese, [place]NP 07/02/15 [id number]  Bethann BerkshireJoseph Zammit, MD 07/02/15 [phone]

## 2015-07-06 LAB — WOUND CULTURE: Gram Stain: NONE SEEN

## 2015-07-08 ENCOUNTER — Telehealth (HOSPITAL_BASED_OUTPATIENT_CLINIC_OR_DEPARTMENT_OTHER): Payer: Self-pay | Admitting: Emergency Medicine

## 2015-07-08 NOTE — Telephone Encounter (Signed)
Post ED Visit - Positive Culture Follow-up  Culture report reviewed by antimicrobial stewardship pharmacist:  []  Steven Skinner, Pharm.D. []  Steven Skinner, Pharm.D., BCPS []  Steven Skinner, Pharm.D. []  Steven Skinner, [address] BCPS []  Steven Skinner, [address]Ph[address] BCPS, AAHIVP []  Steven Skinner, Pharm.D., BCPS, AAHIVP [x]  Steven Skinner, Pharm.D. []  Steven Skinner, [place].  Positive wound culture Treated with doxycycline, organism sensitive to the same and no further patient follow-up is required at this time.  Steven Skinner, Steven Skinner 07/08/2015, 10:42 AM

## 2016-03-07 IMAGING — DX DG ORBITS FOR FOREIGN BODY
2 series · 2 of 2 positions shown · non-contrast
Comparison: CT brain 11/06/2014.

CLINICAL DATA: Metal working/exposure; clearance prior to MRI

EXAM:
ORBITS FOR FOREIGN BODY - 2 VIEW

[orbits pa (1 of 2)]
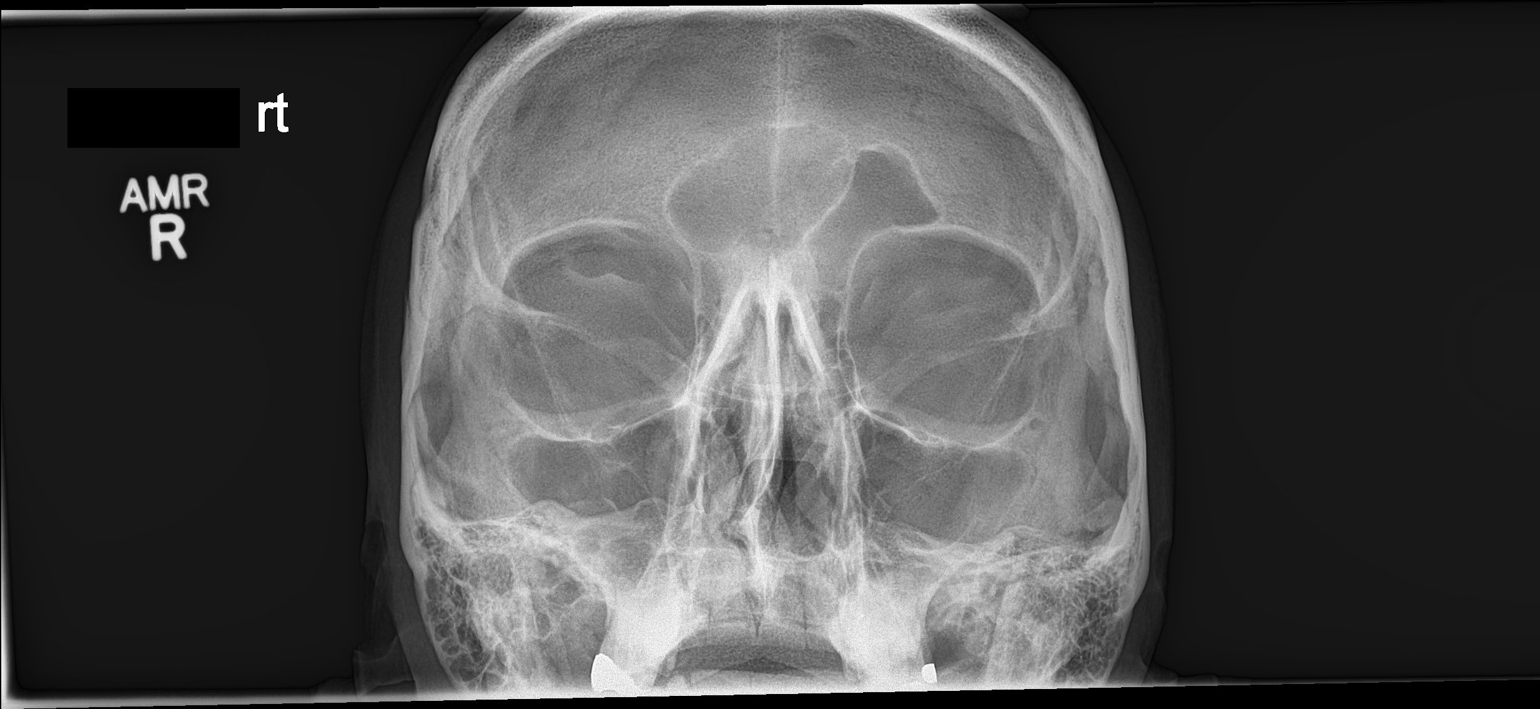

[orbits pa (2 of 2)]
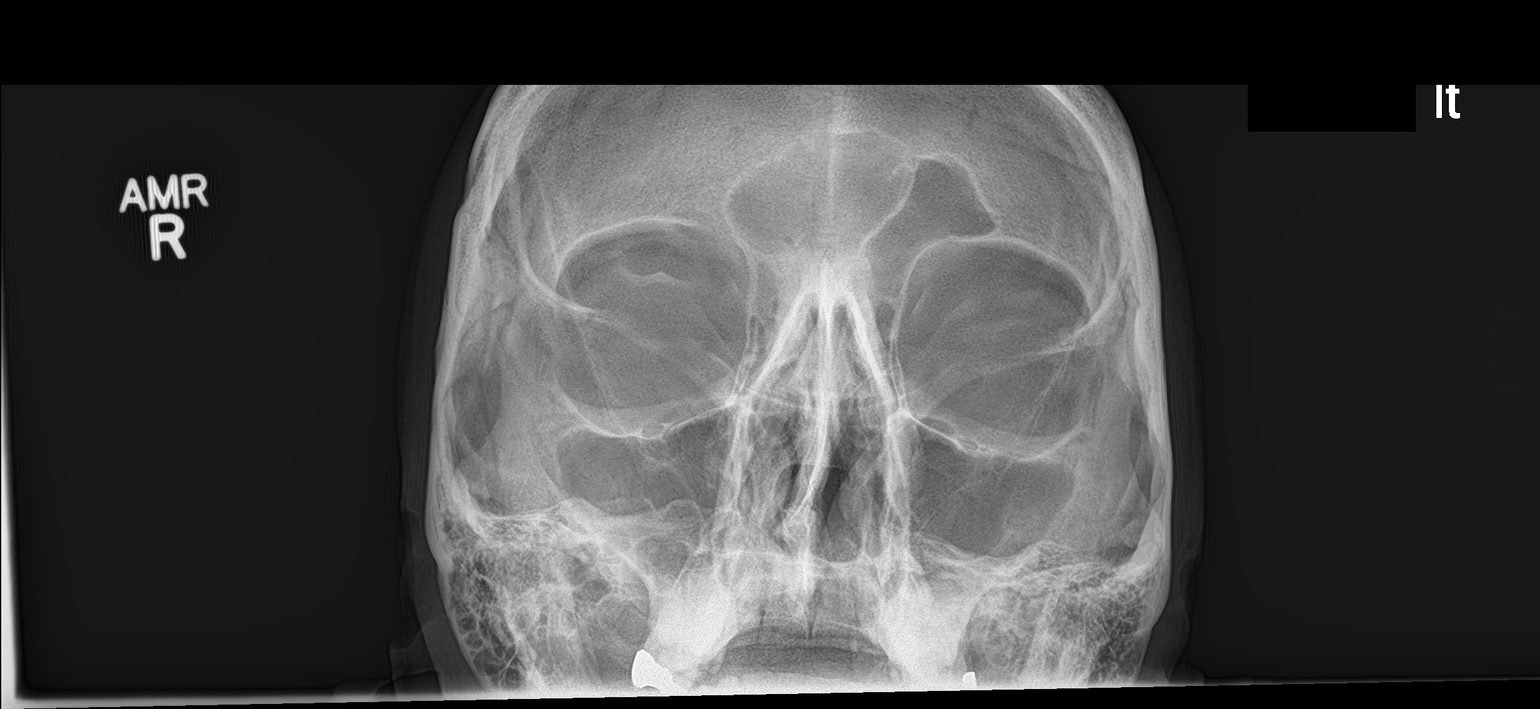

[2 of 2 positions shown; findings below may reference images not displayed]

FINDINGS: No visible radiopaque foreign body by plain film. There is
questionable tiny metallic flexed within the right upper lobe on
image 5 of today's head CT. This cannot be appreciated on plain
film.
IMPRESSION: No visible metallic foreign body within the orbits.

## 2023-02-17 DEATH — deceased

## 2023-08-06 ENCOUNTER — Other Ambulatory Visit: Payer: Self-pay
